# Patient Record
Sex: Male | Born: 1995 | Race: White | Hispanic: No
Health system: Western US, Academic
[De-identification: ages and names within clinical notes are randomized; demographics above are authoritative.]

## PROBLEM LIST (undated history)

## (undated) DIAGNOSIS — S4291XA Fracture of right shoulder girdle, part unspecified, initial encounter for closed fracture: Secondary | ICD-10-CM

## (undated) DIAGNOSIS — S329XXA Fracture of unspecified parts of lumbosacral spine and pelvis, initial encounter for closed fracture: Secondary | ICD-10-CM

## (undated) DIAGNOSIS — IMO0002 Reserved for concepts with insufficient information to code with codable children: Secondary | ICD-10-CM

## (undated) HISTORY — PX: BACK SURGERY: SHX140

## (undated) HISTORY — DX: Fracture of right shoulder girdle, part unspecified, initial encounter for closed fracture: S42.91XA

## (undated) HISTORY — DX: Reserved for concepts with insufficient information to code with codable children: IMO0002

## (undated) HISTORY — DX: Fracture of unspecified parts of lumbosacral spine and pelvis, initial encounter for closed fracture: S32.9XXA

## (undated) HISTORY — PX: PR UNLISTED PROCEDURE SHOULDER: 23929

---
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
## 2017-03-14 ENCOUNTER — Encounter (INDEPENDENT_AMBULATORY_CARE_PROVIDER_SITE_OTHER): Payer: Self-pay | Admitting: Family Medicine

## 2017-03-14 ENCOUNTER — Ambulatory Visit (INDEPENDENT_AMBULATORY_CARE_PROVIDER_SITE_OTHER): Payer: No Typology Code available for payment source | Admitting: Family Medicine

## 2017-03-14 ENCOUNTER — Ambulatory Visit (INDEPENDENT_AMBULATORY_CARE_PROVIDER_SITE_OTHER): Payer: Self-pay

## 2017-03-14 VITALS — BP 147/87 | HR 88 | Temp 98.5°F | Wt 190.0 lb

## 2017-03-14 DIAGNOSIS — S99911A Unspecified injury of right ankle, initial encounter: Secondary | ICD-10-CM
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
## 2017-03-19 ENCOUNTER — Telehealth (INDEPENDENT_AMBULATORY_CARE_PROVIDER_SITE_OTHER): Payer: Self-pay

## 2017-03-19 NOTE — Telephone Encounter (Signed)
Spoke to Eric Flowers (Eric Flowers) on 03/18/17, about having the x-ray [facility] to [facility] for [place]'s [place]. She wanted to get the x-ray to be sent a.s.a.p.  Contacted the [facility] File Room, they gave 2 options for expediting the x-ray (CD).[employer] or [employer].  Eric BadKristie [place] to [place] [place], and [facility] will mail out the CD.([employer])    If she changes her mind, she will download the ROI from website and have Eric Flowers sign it and bring to [facility], where we will give her the CD to mail out.    Also faxing Chart notes, per patient's request from visit on 03/14/17.    Faxing to [phone]  PH:[phone]

## 2017-07-19 ENCOUNTER — Telehealth (HOSPITAL_BASED_OUTPATIENT_CLINIC_OR_DEPARTMENT_OTHER): Payer: Self-pay | Admitting: Orthopaedic Surgery
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
## 2017-07-22 NOTE — Telephone Encounter (Signed)
Appointment must be scheduled as a new patient. Routing to [facility] for review and/or completion of task

## 2017-07-22 NOTE — Telephone Encounter (Signed)
Spoke to patients mother and scheduled appointment.    Letter sent in mail.

## 2017-07-29 ENCOUNTER — Encounter (HOSPITAL_BASED_OUTPATIENT_CLINIC_OR_DEPARTMENT_OTHER): Payer: Self-pay | Admitting: Orthopaedic Surgery

## 2017-08-01 ENCOUNTER — Other Ambulatory Visit (HOSPITAL_BASED_OUTPATIENT_CLINIC_OR_DEPARTMENT_OTHER): Payer: Self-pay

## 2017-08-01 DIAGNOSIS — S32810D Multiple fractures of pelvis with stable disruption of pelvic ring, subsequent encounter for fracture with routine healing: Secondary | ICD-10-CM

## 2017-08-05 ENCOUNTER — Ambulatory Visit (HOSPITAL_BASED_OUTPATIENT_CLINIC_OR_DEPARTMENT_OTHER): Payer: No Typology Code available for payment source | Attending: Orthopaedic Surgery | Admitting: Registered Nurse

## 2017-08-05 ENCOUNTER — Ambulatory Visit (HOSPITAL_BASED_OUTPATIENT_CLINIC_OR_DEPARTMENT_OTHER): Payer: No Typology Code available for payment source

## 2017-08-05 VITALS — BP 133/78 | HR 72 | Temp 97.7°F | Resp 16

## 2017-08-05 DIAGNOSIS — S32810D Multiple fractures of pelvis with stable disruption of pelvic ring, subsequent encounter for fracture with routine healing: Secondary | ICD-10-CM

## 2017-08-05 DIAGNOSIS — M25552 Pain in left hip: Secondary | ICD-10-CM

## 2017-08-05 DIAGNOSIS — M79652 Pain in left thigh: Secondary | ICD-10-CM | POA: Insufficient documentation
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
## 2017-08-05 NOTE — Patient Instructions (Signed)
Here are some general instructions and information given to all patients:  1. Avoid NSAIDs (Aleve, Advil, Motrin, Ibuprofen) until you are instructed that it is ok.  2. Please take adequate Vitamin D and Calcium which can be prescribed by your primary care provider or purchased from your local pharmacy  3.  It is important to follow the  weight bearing restrictions reviewed with you today.  4. Smoking even one cigarette a day  has been studied and shown to slow fracture healing .    It was nice to meet you today. Please contact us if you have any questions or concerns that still need addressing.     [facility]   [phone]

## 2017-08-09 ENCOUNTER — Encounter (HOSPITAL_BASED_OUTPATIENT_CLINIC_OR_DEPARTMENT_OTHER): Payer: Self-pay | Admitting: Orthopaedic Surgery

## 2017-09-10 ENCOUNTER — Encounter (HOSPITAL_BASED_OUTPATIENT_CLINIC_OR_DEPARTMENT_OTHER): Payer: Self-pay | Admitting: Physical Medicine & Rehabilitation

## 2017-09-10 ENCOUNTER — Ambulatory Visit (HOSPITAL_BASED_OUTPATIENT_CLINIC_OR_DEPARTMENT_OTHER)
Payer: No Typology Code available for payment source | Attending: Physical Medicine & Rehabilitation | Admitting: Physical Medicine & Rehabilitation

## 2017-09-10 VITALS — BP 153/84 | HR 84 | Ht 66.0 in | Wt 190.6 lb

## 2017-09-10 DIAGNOSIS — M76891 Other specified enthesopathies of right lower limb, excluding foot: Secondary | ICD-10-CM | POA: Insufficient documentation

## 2017-09-10 DIAGNOSIS — Z8781 Personal history of (healed) traumatic fracture: Secondary | ICD-10-CM | POA: Insufficient documentation

## 2017-09-10 NOTE — Progress Notes (Signed)
TEL: [phone]    FAX: [phone]  [email or web address]    Specializing in sports-related injuries and non-surgical care for conditions of the spine,  shoulder, elbow, wrist, hand, hip, knee, foot, and ankle.        09/10/2017    PRIMARY CARE PROVIDER:   Ree Kidaarol J Wilder, MD  [facility] [address]  [place], [place] [id number]    CONSULTING PROVIDER:   Jen MowReena Neogi  [address]  [place] [phone]  [place] [phone]    PATIENT: Eric Flowers    [id number]    REASON FOR VISIT/CC: Posterior hip pain    Dear Dr. Lennox LaityNeogi,    I had the pleasure of seeing your patient Eric GentaConnor R Jurek in consultation today. As you know, Eric Flowers is a very pleasant 22 year old yo with left hip/buttock pain.    The patient describes their pain as...  Onset:     September 2012 ORIF of pelvic ring disruption consisting a pubic symphysis disruption, left acetabular fracture, and left SI joint disruption.  New left proximal medial and posterior thigh pain since November 2018. Recent exam by orthopedic team was not concerning for pelvic instability or intra-articular hip pathology. Prior imaging demonstrates fracture of screws since 2013, but no interval backing out of screws compared to new images.  They would not recommend hardware removal from anterior pelvic ring.  Rather they recommended patient be evaluated by Dr. Eulogio BearLoveless evaluation for soft tissue injury including diagnostic ultrasound.  Location: posterior hip   Radiation: Lateral to medial posterior hip  Characteristic: 0 / 10 today (at worst 4/10), sharp, momentary (minute of pain improved to only seconds of pain)  Exacerbation: Stepping over a couch, walking, picking up a bag of sand  Relief: stretching the hip  Course: Intermittent (initially happening 3-5x per week, but has been improving recently)  Associated Symptoms: Denies numbness or tingling in the lower extremities  Other Treatments/Procedures Tried:    Weight loss       Working out x 2 weeks (elliptical)   Employed at [employer] - lots of heavy lifting  Prior Imaging/Diagnostics    08/05/17 XR Pelvis: FINDINGS AND IMPRESSION:  Status post screw fixation of the left SI joint and plate and screw fixation   of the bilateral parasymphyseal obturator ring fractures. As before, there is   fracture of the first, second and fourth screws from the right along with a   fracture of the plate.     Appropriate progression towards healing. No significant change in position or   alignment. No new fracture or dislocation.    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       06/14/11 x-ray lumbar spine: Findings:  Normal alignment. No instability on dynamic views. Since the initial postop   study, there has been slightly more wedging of the T12 fractured vertebral   body. This is unchanged from the most recent comparison study in December,   2012. The remainder the visualized spine is unremarkable. 2 left sacroiliac   screws remain in place.    PAST MEDICAL HISTORY:  Patient Active Problem List   Diagnosis    Contusion of right knee       PAST SURGICAL HISTORY:   He  has a past surgical history that includes UNLISTED PROCEDURE SPINE (2012); ANES; PELVIS/HIP JOINT SURGERY UNLISTED (2012); UNLISTED PROCEDURE SPINE (2012); and UNLISTED PROCEDURE SHOULDER.  ALLERGIES:   Review of patient's allergies indicates:  No Known Allergies    MEDICATIONS:   Current Outpatient Medications   Medication Sig Dispense Refill    Dexmethylphenidate HCl (FOCALIN) 10 MG Oral Tab Take 20 mg by mouth.       Dexmethylphenidate HCl ER 15 MG Oral CAPSULE SR 24 HR       GuanFACINE HCl 1 MG Oral Tab       GuanFACINE HCl ER 2 MG Oral TABLET SR 24 HR        No current facility-administered medications for this visit.        FAMILY HISTORY:   Pertinent family history includes Cancer in his maternal grandmother and mother; Heart (other) in his father and paternal  grandmother; Hypertension in his maternal grandfather; Prostate Cancer in his maternal grandfather and paternal grandfather; Thyroid Disease in his mother.    SOCIAL HISTORY:  He  reports that he has never smoked. He has never used smokeless tobacco. He reports that he does not drink alcohol or use drugs.  Patient is in college at [employer].     REVIEW OF SYSTEMS: Except for those mentioned in the HPI and listed below, complete review of systems is negative: Easy bruising, dark stools      PHYSICAL EXAMINATION:  VITAL SIGNS: BP (!) 153/84    Pulse 84    Ht 5\' 6"  (1.676 m)    Wt 190 lb 9.6 oz (86.5 kg)    BMI 30.76 kg/m  Exercise Vitals: Total Minutes of Exercise per Week: 180.  GENERAL: He is Well-Developed and Well Nourished. They are alert and oriented.  PSYCHOLOGICAL:  No acute distress. Mood euthymic. Normal affect.  HEENT: Normal cephalic, atraumatic. Non-interic sclera. Moist mucous membranes   RESPIRATORY:  Non-labored breathing  CARDIOVASCULAR: No cyanosis, clubbing or edema. Intact peripheral pulses.  SKIN: No atrophy, rashes or open wounds involving the lower extremities    Neuro:  Strength is 5/5 Lower extremities bilaterally with the exception of 4/5 prone hip external rotation (with knee flexion) due to pain reproduction.   Able to walk on heels and toes 10 steps.  Sensation intact to light touch in dermatomes L2-S1  Deep tendon Reflexes 2+ and symmetric    MSK Hip:  Gait: Normal  Passive Internal rotation:  Full without pain    Passive External rotation: Full and no pain  Palpation: No TTP over greater trochanter, glute med, ischial tuberosity  FADIR maneuver: Negative   Scour: Negative  FABER (Patricks): Negative Causes   Resisted straight leg raise (Stinchfield's): Negative  Resisted hip flexion with knee flexed: Negative  Resisted hip external rotation (lateral rotators): Reproduces pain posteriorly   No pain with resisted adduction.     SI joint provocative  maneuvers:  Gaeslens: negative, Sacral thrust: negative, Posterior  thigh thrust: negative    Imaging: Reviewed with the patient.      Impression  1. Left Posterior Hip Pain   2. Strain/Tendinitis of the Hip External Rotators    Discussion/Medical Decision Making:  I had thorough discussion with the patient regarding the above impression and foregoing management plan.     His history and exam today were consistent with a strain or tendinitis of the external hip rotators (which include the piriformis, gemelli, internal obturator, and quadratus femoris muscles).  Normally I would not recommend a diagnostic ultrasound or MRI for pain that is improving and becoming less frequent, especially in the setting of increased exercise;  I would treat this injury with physical therapy primarily.  However this patient has unique history with pelvic hardware and known fractured screws in place since 2013.  Therefore there could be a complicating factor if there is posterior displacement of the hardware into the muscle belly or tendon of one of the hip external rotators.  Given the patient's improvement I suspect this is unlikely, but both the patient and his surgical team are interested in the results of the diagnostic ultrasound.  Regarding the diagnostic ultrasound, we will schedule it at [facility] on July 17th at 11:30 AM.     I spent a total time of 45 minutes face-to-face with the patient, of which more than 50% was spent counseling and coordinating care and reviewing records with the patient as outlined in this note.    Charise Carwin, M.D., [facility]  Clinical Assistant Professor & Attending Physician  [facility] at [facility] and [facility]

## 2017-09-10 NOTE — Patient Instructions (Signed)
Your exam today was consistent with a strain or tendinitis of the external hip rotators (which include the piriformis, gemelli, internal obturator, and quadratus femoris muscles). The question will be "is there a screw or hardware projecting into one of these muscles?" although I suspect this is unlikely given your improvement.    I would treat this injury with physical therapy primarily.     Regarding your diagnostic ultrasound, we will schedule it at [facility] on July 17th at 11:30 AM.

## 2017-09-12 ENCOUNTER — Telehealth (HOSPITAL_BASED_OUTPATIENT_CLINIC_OR_DEPARTMENT_OTHER): Payer: Self-pay | Admitting: Orthopaedic Surgery

## 2017-09-12 NOTE — Telephone Encounter (Signed)
Per patient requested scheduled an appointment on Pelvic day after July 22nd

## 2017-09-12 NOTE — Telephone Encounter (Signed)
(  TEXTING IS AN OPTION FOR [facility] CLINICS ONLY)  Is this a [facility] clinic? No      RETURN CALL: Detailed message on voicemail only      SUBJECT:  Appointment Request     REASON FOR REQUEST/SYMPTOMS: Screws backing out  REFERRING PROVIDER: na  REQUEST APPOINTMENT WITH: Dr Everlean PattersonKleweno  REQUESTED DATE: 7/24, TIME: Early morning or 1130 or after 4  UNABLE TO APPOINT BECAUSE: Please callback to discuss a earlier appointment as patient had to cancel 7/15 as imaging isnt until 7/17 and patient needs sooner then CCR next available in August as patient is going back to college. Please callback to discuss a sooner appointment, thanks.

## 2017-09-16 ENCOUNTER — Encounter (HOSPITAL_BASED_OUTPATIENT_CLINIC_OR_DEPARTMENT_OTHER): Payer: No Typology Code available for payment source | Admitting: Orthopaedic Surgery

## 2017-09-18 ENCOUNTER — Other Ambulatory Visit (HOSPITAL_BASED_OUTPATIENT_CLINIC_OR_DEPARTMENT_OTHER): Payer: No Typology Code available for payment source | Admitting: Physical Medicine & Rehabilitation

## 2017-09-23 ENCOUNTER — Ambulatory Visit
Payer: No Typology Code available for payment source | Attending: Physical Medicine & Rehabilitation | Admitting: Physical Medicine & Rehabilitation

## 2017-09-23 ENCOUNTER — Encounter (HOSPITAL_BASED_OUTPATIENT_CLINIC_OR_DEPARTMENT_OTHER): Payer: Self-pay | Admitting: Physical Medicine & Rehabilitation

## 2017-09-23 VITALS — BP 136/86 | HR 75 | Ht 66.5 in | Wt 190.0 lb

## 2017-09-23 DIAGNOSIS — Z8781 Personal history of (healed) traumatic fracture: Secondary | ICD-10-CM

## 2017-09-23 DIAGNOSIS — M76891 Other specified enthesopathies of right lower limb, excluding foot: Secondary | ICD-10-CM | POA: Insufficient documentation

## 2017-09-23 NOTE — Progress Notes (Signed)
Complete diagnostic ultrasound scan of the posterior hip  Patient position: Prone  Probe: A combination of a curvilinear array transducer and a linear array transducer was used.  Indication: Posterior hip pain  Scanning:  A complete diagnostic scan of the posterior thigh was performed. The following areas were viewed in both their anatomic long and short axis: The gluteus maximus and medius muscles.  The ischial tuberosity, the conjoint tendon, the proximal semimembranosus tendon, the ischiocondylar head of the adductor magnus, the quadratus femoris, the piriformis, the obturator internus, the gemellus inferior and superior and sciatic nerve. The semimembranosus and semitendinosus muscle bellies, the biceps femoris muscle belly both long and short heads and the myotendinous junction. The ischiofemoral space with dynamic hip external rotation.     Findings:  Gluteus medius and minimus and maximus: these muscle bellies were viewed overlying the following structures; there was no apparent abnormality within the muscle bellies.    Piriformis,  gemellus superior, obturator internus, gemellus inferior, quadratus femoris : Normal echogenicity in the region of interest; no evidence of hardware protruding into muscle bellies; no evidence of high grade muscle tearing on dynamic resistance; no evidence of tendon thickening or calfications    Sciatic nerve: No sign of nerve compression, with consistent cross sectional area in SAX, without notch sign on LAX. There was no dynamic impingement of the nerve with resisted external rotation.     Semitendinosus, semimembranosus, biceps femoris, and ischiocondylar head of adductor magnus: Normal echogenicity in the region of interest; no evidence of tendon thickening, hypoechoic regions, calcifications, or associated cortical irregularity at the ischium    Ischiofemoral space: The ischiofemoral space measured 1.4 cm with dynamic hip external rotation. The patient denied pain  reproduction in this position.      IMPRESSIONS:   1. Normal Posterior Left Hip. (No sign of hardware protrusion into the posterior hip musculature)  2. Radiographic diagnosis of asymptomatic ischiofemoral impingement (1.4 cm). This could be better assessed with MRI to evaluate for quadratus femoris muscle belly edema.      Charise CarwinErek W. Xochilth Standish, M.D., [facility]  Clinical Assistant Professor & Attending Physician  [facility] at [facility] and [facility]

## 2017-09-30 ENCOUNTER — Ambulatory Visit (HOSPITAL_BASED_OUTPATIENT_CLINIC_OR_DEPARTMENT_OTHER): Payer: No Typology Code available for payment source | Attending: Registered Nurse | Admitting: Registered Nurse

## 2017-09-30 ENCOUNTER — Other Ambulatory Visit: Payer: Self-pay

## 2017-09-30 VITALS — BP 136/79 | HR 87 | Ht 67.0 in | Wt 185.0 lb

## 2017-09-30 DIAGNOSIS — S32810D Multiple fractures of pelvis with stable disruption of pelvic ring, subsequent encounter for fracture with routine healing: Secondary | ICD-10-CM | POA: Insufficient documentation

## 2017-09-30 DIAGNOSIS — M25552 Pain in left hip: Secondary | ICD-10-CM

## 2017-09-30 NOTE — Progress Notes (Signed)
Chief Complaint:  Status post ORIF of pelvic ring disruption consisting of pubic symphysis disruption, left acetabular fracture, and left SI joint disruption performed by Dr. Jyl Heinz on 27 November 2010.    HPI:  Patient is a 22 year old male presenting 6-1/2 years status post ORIF of pelvic ring disruption consisting of pubic symphysis disruption, left acetabular fracture, and left SI joint disruption performed by Dr. Jyl Heinz in September 2012 who was seen in clinic 2 months ago for intermittent sudden sharp aching pain throughout left proximal medial and posterior thigh that began in November 2018 and occurred in increasing frequency over the prior 6 months. Since his last visit, patient reports that this pain has progressively improved. Attributes this improvement in symptoms with recent increase in his activity and loss of weight. Patient states that he has been working at a job the last couple months that requires a lot of lifting and strength, which he feels has improved his overall strength and function. Patient was also seen by [facility] PM[facility]R team for evaluation for potential soft tissue injury of his left hip. They found that his history and exam were consistent with a strain or tendinitis of the external hip rotators, no concerning or clinically significant findings noted on ultrasound. Patient reports that overall he is doing well and happy with his progress to date. He plans to return to [place] in the coming weeks to attend college, would like to attend physical therapy while there. Denies any weakness in his left lower extremity. Denies fever, chills, chest pain, or difficulty breathing. Denies any new numbness, weakness, tingling, or paresthesia. Denies any bowel or bladder issues.     Physical Exam:   GENERAL: Well-developed, no acute distress  VS:   Vitals:    09/30/17 1121   BP: 136/79   BP Cuff Size: Large   BP Site: Right Arm   BP Position: Sitting   Pulse: 87   SpO2: 97%   Weight: 185  lb (83.9 kg)   Height: 5\' 7"  (1.702 m)     MUSCULOSKELETAL: Pelvis Right lower extremity   Inspection: Incisions well healed, no drainage or surrounding erythema.    ROM: hip range of motion 0-90, knee range of motion 0-130, ankle dorsiflexion to 10, plantarflexion to 30.  Motor: Quadriceps, hamstrings, gastrocsoleus complex, tibialis anterior, EHL, and FHL intact all with 5/5 strength. No pain with provocative testing of left hip, SI joint, or anterior pelvic ring.   Sensation: Grossly intact to light touch along tibial, sural, saphenous, deep and superficial peroneal nerve distributions.   Vascular: Warm and well-perfused, dorsalis pedis and posterior tibial pulse 2+.    Imaging:  No x-rays ordered today.     Impression:   Patient is a 22 year old male presenting 6-1/2 years status post ORIF of pelvic ring disruption consisting of pubic symphysis disruption, left acetabular fracture, and left SI joint disruption performed by Dr. Jyl Heinz in September 2012 who was seen in clinic 2 months ago for intermittent sudden sharp aching pain throughout left proximal medial and posterior thigh that began in November 2018 and occurred in increasing frequency over the prior 6 months. Since his last visit, patient reports that this pain has progressively improved. Overall, patient is clinically stable and progressing well.     Plan:   - Weightbear as tolerated on bilateral lower extremities; range of motion as tolerated.   - Referral to physical therapy provided today for patient to work on gait training, balance and  proprioception, hip and  lumbo-pelvic core stabilization exercises, and active, active assisted, and passive range of motion exercises of the bilateral lower extremities with particular focus on hip ROM, low impact conditioning exercises and progressive strengthening exercises of quadriceps, hamstrings, abductors, hip flexors, and gluteal muscles. Edema control and other modalities as needed.   - May take  ibuprofen, with food, or tylenol as needed for pain.     Patient may return to clinic on as needed basis.     Patient evaluated and plan formulated with Dr. Everlean PattersonKleweno.

## 2017-09-30 NOTE — Patient Instructions (Signed)
Here are some general instructions and information given to all patients:  1. Avoid NSAIDs (Aleve, Advil, Motrin, Ibuprofen) until you are instructed that it is ok.  2. Please take adequate Vitamin D and Calcium which can be prescribed by your primary care provider or purchased from your local pharmacy  3.  It is important to follow the  weight bearing restrictions reviewed with you today.  4. Smoking even one cigarette a day  has been studied and shown to slow fracture healing .    It was nice to meet you today. Please contact us if you have any questions or concerns that still need addressing.     [facility]   [phone]

## 2018-12-15 ENCOUNTER — Emergency Department (HOSPITAL_COMMUNITY): Payer: BC Managed Care – PPO

## 2018-12-15 ENCOUNTER — Emergency Department (HOSPITAL_COMMUNITY)
Admission: EM | Admit: 2018-12-15 | Discharge: 2018-12-15 | Disposition: A | Discharge status: Home / Self Care | Payer: BC Managed Care – PPO | Attending: Emergency Medicine | Admitting: Emergency Medicine

## 2018-12-15 ENCOUNTER — Other Ambulatory Visit: Payer: Self-pay

## 2018-12-15 ENCOUNTER — Encounter (HOSPITAL_COMMUNITY): Payer: Self-pay | Admitting: *Deleted

## 2018-12-15 DIAGNOSIS — Z967 Presence of other bone and tendon implants: Secondary | ICD-10-CM | POA: Diagnosis not present

## 2018-12-15 DIAGNOSIS — Z20828 Contact with and (suspected) exposure to other viral communicable diseases: Secondary | ICD-10-CM | POA: Diagnosis not present

## 2018-12-15 DIAGNOSIS — R079 Chest pain, unspecified: Secondary | ICD-10-CM | POA: Diagnosis present

## 2018-12-15 DIAGNOSIS — R0789 Other chest pain: Secondary | ICD-10-CM | POA: Diagnosis not present

## 2018-12-15 LAB — CBC WITH DIFFERENTIAL/PLATELET
Abs Immature Granulocytes: 0.04 10*3/uL (ref 0.00–0.07)
Basophils Absolute: 0.1 10*3/uL (ref 0.0–0.1)
Basophils Relative: 0 %
Eosinophils Absolute: 0.1 10*3/uL (ref 0.0–0.5)
Eosinophils Relative: 1 %
HCT: 47.7 % (ref 39.0–52.0)
Hemoglobin: 17.8 g/dL — ABNORMAL HIGH (ref 13.0–17.0)
Immature Granulocytes: 0 %
Lymphocytes Relative: 12 %
Lymphs Abs: 1.6 10*3/uL (ref 0.7–4.0)
MCH: 31.9 pg (ref 26.0–34.0)
MCHC: 37.3 g/dL — ABNORMAL HIGH (ref 30.0–36.0)
MCV: 85.5 fL (ref 80.0–100.0)
Monocytes Absolute: 1.3 10*3/uL — ABNORMAL HIGH (ref 0.1–1.0)
Monocytes Relative: 9 %
Neutro Abs: 10.5 10*3/uL — ABNORMAL HIGH (ref 1.7–7.7)
Neutrophils Relative %: 78 %
Platelets: 157 10*3/uL (ref 150–400)
RBC: 5.58 MIL/uL (ref 4.22–5.81)
RDW: 12.1 % (ref 11.5–15.5)
WBC: 13.7 10*3/uL — ABNORMAL HIGH (ref 4.0–10.5)
nRBC: 0 % (ref 0.0–0.2)

## 2018-12-15 LAB — BASIC METABOLIC PANEL
Anion gap: 13 (ref 5–15)
BUN: 9 mg/dL (ref 6–20)
CO2: 23 mmol/L (ref 22–32)
Calcium: 9.9 mg/dL (ref 8.9–10.3)
Chloride: 104 mmol/L (ref 98–111)
Creatinine, Ser: 0.89 mg/dL (ref 0.61–1.24)
GFR calc Af Amer: >60 mL/min (ref >60)
GFR calc non Af Amer: >60 mL/min (ref >60)
Glucose, Bld: 102 mg/dL — ABNORMAL HIGH (ref 70–99)
Potassium: 3.3 mmol/L — ABNORMAL LOW (ref 3.5–5.1)
Sodium: 140 mmol/L (ref 135–145)

## 2018-12-15 LAB — SARS CORONAVIRUS 2 (TAT 6-24 HRS): SARS Coronavirus 2: NEGATIVE

## 2018-12-15 MED ORDER — IOHEXOL 350 MG/ML SOLN
100.0000 mL | Freq: Once | INTRAVENOUS | Status: AC | PRN
  Administered 2018-12-15: 100 mL via INTRAVENOUS

## 2018-12-15 MED ORDER — CYCLOBENZAPRINE HCL 10 MG PO TABS: 10.0000 mg | ORAL_TABLET | Freq: Two times a day (BID) | ORAL | 0 refills | Status: AC | PRN

## 2018-12-15 MED ORDER — IBUPROFEN 800 MG PO TABS: 800.0000 mg | ORAL_TABLET | Freq: Three times a day (TID) | ORAL | 0 refills | Status: AC

## 2018-12-15 NOTE — ED Provider Notes (Signed)
[facility] EMERGENCY DEPARTMENT Provider Note   CSN: [id number] Arrival date & time: 12/15/18  0007     History   Chief Complaint Chief Complaint  Patient presents with  . Chest Pain    HPI Alexander Mclaughlin is a 23 y.o. male.     Patient presents to the emergency department with a chief complaint of right sided upper chest pain.  He states that the symptoms began about 7 to 10 days ago.  He reports onset after inhaling whippets.  He states that the symptoms have been persistent.  He denies any fever, chills, cough.  States the symptoms are worsened with breathing.  Denies any other associated symptoms.  Denies any successful treatments prior to arrival.  Denies any history of PE or DVT.  Denies any recent surgery, long travel, or immobilization.  The history is provided by the patient. No language interpreter was used.    History reviewed. No pertinent past medical history.  There are no active problems to display for this patient.   Past Surgical History:  Procedure Laterality Date  . BACK SURGERY          Home Medications    Prior to Admission medications   Not on File    Family History No family history on file.  Social History Social History   Tobacco Use  . Smoking status: Never Smoker  Substance Use Topics  . Alcohol use: Yes  . Drug use: Not Currently     Allergies   Patient has no allergy information on record.   Review of Systems Review of Systems  All other systems reviewed and are negative.    Physical Exam Updated Vital Signs BP (!) 161/98   Pulse (!) 108   Temp 98.6 F (37 C) (Oral)   Resp 18   Ht 5\' 8"  (1.727 m)   Wt 88.5 kg   SpO2 100%   BMI 29.65 kg/m   Physical Exam Vitals signs and nursing note reviewed.  Constitutional:      Appearance: He is well-developed.  HENT:     Head: Normocephalic and atraumatic.  Eyes:     Conjunctiva/sclera: Conjunctivae normal.  Neck:     Musculoskeletal: Neck  supple.  Cardiovascular:     Rate and Rhythm: Normal rate and regular rhythm.     Heart sounds: No murmur.  Pulmonary:     Effort: Pulmonary effort is normal. No respiratory distress.     Breath sounds: Normal breath sounds.  Abdominal:     Palpations: Abdomen is soft.     Tenderness: There is no abdominal tenderness.  Skin:    General: Skin is warm and dry.  Neurological:     Mental Status: He is alert.      ED Treatments / Results  Labs (all labs ordered are listed, but only abnormal results are displayed) Labs Reviewed  SARS CORONAVIRUS 2 (TAT 6-24 HRS)  CBC WITH DIFFERENTIAL/PLATELET  BASIC METABOLIC PANEL    EKG EKG Interpretation  Date/Time:  Monday December 15 2018 00:15:24 EDT Ventricular Rate:  112 PR Interval:  132 QRS Duration: 88 QT Interval:  328 QTC Calculation: 447 R Axis:   99 Text Interpretation:  Sinus tachycardia Rightward axis Borderline ECG No old tracing to compare Confirmed by Delora Fuel ([id number]) on 12/15/2018 12:23:34 AM   Radiology Dg Chest 2 View  Result Date: 12/15/2018 CLINICAL DATA:  Chest pain EXAM: CHEST - 2 VIEW COMPARISON:  None. FINDINGS: The heart size and mediastinal  contours are within normal limits. Both lungs are clear. Partially visualized spinal rods and fixating screws. There is a fracture through the left spinal rod at the approximate L2 level. IMPRESSION: No active cardiopulmonary disease. Fracture through the left spinal rod at approximate L2 level Electronically Signed   By: Jasmine Pang M.D.   On: 12/15/2018 00:37    Procedures Procedures (including critical care time)  Medications Ordered in ED Medications - No data to display   Initial Impression / Assessment and Plan / ED Course  I have reviewed the triage vital signs and the nursing notes.  Pertinent labs & imaging results that were available during my care of the patient were reviewed by me and considered in my medical decision making (see chart for  details).        Patient with right upper chest pain.  Worsened with deep breathing.  Noted to be tachycardic.  No PE risk factors.  No pneumothorax seen on chest x-ray, but given patient's history of forcefully inhaling whippets, he may benefit from advanced imaging of his chest.  Given his persistent tachycardia, will make this a PE study to look for both PE and occult pneumothorax.  Of note, patient's chest x-ray showed a fracture of the hardware in his spine at the L2 level.  I discussed this with the patient.  He will follow up with his surgeon.  CT PE study negative for PE, fracture, or pneumothorax.  O2 saturation is normal.  Vital signs are stable.  Pain somewhat reproducible with palpation.  Will treat with NSAIDs Flexeril.  Recommend outpatient follow-up.  Final Clinical Impressions(s) / ED Diagnoses   Final diagnoses:  Chest wall pain  Fixation hardware in spine    ED Discharge Orders         Ordered    cyclobenzaprine (FLEXERIL) 10 MG tablet  2 times daily PRN     12/15/18 0446    ibuprofen (ADVIL) 800 MG tablet  3 times daily     12/15/18 0446           Roxy Horseman, PA-C 12/15/18 0447    Mesner, Barbara Cower, MD 12/15/18 [phone]

## 2018-12-15 NOTE — Discharge Instructions (Addendum)
Your emergency department work-up is reassuring, there was no evidence of collapsed lung, infection, rib fracture, or pulmonary embolus seen on your CT scan.  There was an incidental finding regarding the hardware in your spine.  There was a break in 1 of the rods at approximately at the L2 level.  Please discuss this with your doctor or with the surgeon the implant to the hardware.

## 2018-12-15 NOTE — ED Triage Notes (Signed)
Pt reports that he used whippets last week and since then he has a sharp pain in the upper right chest "in my lungs" when he takes a deep breath. He is denying feeling SOB, cough, fevers.

## 2019-01-26 ENCOUNTER — Telehealth (HOSPITAL_BASED_OUTPATIENT_CLINIC_OR_DEPARTMENT_OTHER): Payer: Self-pay | Admitting: Orthopaedic Surgery of the Spine

## 2019-01-26 NOTE — Telephone Encounter (Signed)
RETURN CALL: Voicemail - Detailed Message      SUBJECT:  Appointment Request     REASON FOR VISIT: ED Follow up / Back pain   PREFERRED DATE/TIME: On or after December 4th   ADDITIONAL INFORMATION: Patient went to [facility] in [place] due to back pain and it was discovered that the metal piece in his spine that was placed by Dr. Quintella Reichert was broken. Drue Dun would like to discuss scheduling options as she is very concerned. Please call her to discuss when you can.  Thank you!    [facility]  [address] [place] [address]  [phone]

## 2019-01-27 ENCOUNTER — Other Ambulatory Visit (HOSPITAL_BASED_OUTPATIENT_CLINIC_OR_DEPARTMENT_OTHER): Payer: Self-pay | Admitting: Physician Assistant

## 2019-01-27 DIAGNOSIS — Z981 Arthrodesis status: Secondary | ICD-10-CM

## 2019-02-06 ENCOUNTER — Ambulatory Visit (HOSPITAL_BASED_OUTPATIENT_CLINIC_OR_DEPARTMENT_OTHER): Payer: No Typology Code available for payment source | Attending: Physician Assistant | Admitting: Orthopaedic Surgery

## 2019-02-06 ENCOUNTER — Ambulatory Visit (HOSPITAL_BASED_OUTPATIENT_CLINIC_OR_DEPARTMENT_OTHER): Payer: No Typology Code available for payment source | Admitting: Diagnostic Radiology

## 2019-02-06 ENCOUNTER — Encounter (HOSPITAL_BASED_OUTPATIENT_CLINIC_OR_DEPARTMENT_OTHER): Payer: Self-pay

## 2019-02-06 VITALS — BP 134/75 | HR 72 | Temp 98.4°F | Ht 68.0 in | Wt 193.1 lb

## 2019-02-06 DIAGNOSIS — Z981 Arthrodesis status: Secondary | ICD-10-CM | POA: Insufficient documentation

## 2019-02-06 DIAGNOSIS — Z4789 Encounter for other orthopedic aftercare: Secondary | ICD-10-CM

## 2019-02-06 DIAGNOSIS — T8484XA Pain due to internal orthopedic prosthetic devices, implants and grafts, initial encounter: Secondary | ICD-10-CM | POA: Insufficient documentation

## 2019-02-06 NOTE — Progress Notes (Signed)
Eric Flowers  [id number]  02/06/2019        ORTHOPEDIC SPINE CLINIC NOTE         CHIEF COMPLAINT AND REASON FOR VISIT      Preoperative consultation for removal of hardware T12-L2      HISTORY      Eric Flowers is a 23 year old male here today for a preoperative visit concerning the aforementioned procedure.   Patient was found to have fractured hardware recently on an ER visit in [place].  Patient has continued to have pain in the thoracolumbar area around the previous incision site.  He notices this most when leaning forward or extending backwards.  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  He has noticed his posture has began to change as he is leaning forward.  He presents with mother today at bedside who voices her concern over upcoming changes in her insurance secondary to loss of job.         PHYSICAL EXAMINATION      GENERAL: The patient is a male in no acute distress. He can walk without using assistive devices    BP 134/75   Pulse 72   Temp 98.4 F (36.9 C)   Ht 5\' 8"  (1.727 m)   Wt 193 lb 1.6 oz (87.6 kg)   SpO2 98%   BMI 29.36 kg/m     MUSCULOSKELETAL:        Motor Strength Right Left   L2 Hip flexion (Iliopsoas) 5/5 5/5   L3 Knee extension (Quad) 5/5 5/5   L4 Ankle DF (TA) 5/5 5/5   L5 Great Toe DF (EHL) 5/5 5/5   S1 Ankle PF, Foot Eversion (Peroneal longus/brevis) 5/5 5/5   S2 Great toe flexion (FHL), Knee flexion  5/5 5/5     Sensation Right Left   L2 Proximal anterior thigh normal normal   L3 Mid anterior thigh normal normal   L4 Medial leg/foot, great toe (Saphenous n.) normal normal   L5 Dorsum of mid foot  normal normal   S1 Lateral leg/foot, little toe, Back of leg (Sural n.) normal normal     Reflexes Right Left   L4 Patellar 2+/4 2+/4   S1 Achilles 2+/4 2+/4   Clonus  0 beats 0 beats   Babinski absent absent            Gait is normal.         ENT:  Mucous membranes are moist, Good dentition and Exam  deferred  ABDOMEN:  Nondistended and No guarding  RESPIRATORY:  Unlabored breathing and Good excursion  CARDIOVASCULAR:  Regular rate and rhythm  PSYCHOLOGIC REVIEW:  Appropriate affect         IMAGING    Radiographs were reviewed today demonstrating previous T12-L2 fusion for an L1 burst fracture.  Patient was 15 when the initial injury occurred these injury films were reviewed and demonstrate healed fracture postoperatively with hardware intact.  There is no films until today's radiographs taken in office demonstrating fracture of the left-sided rod proximal to the distal screw and a right-sided screw head interface. USS 1 hardware present.       ASSESSMENT AND PLAN      Eric Flowers is 23 year old male who is here today for a preoperative consultation for painful hardware and removal of hardware T12-L2.  Patient continues to have back pain present secondary to fractured hardware.  Patient  is currently under his mother's insurance which unfortunately will expire secondary to loss of job.  Continues to have issues when attempting to perform activities and feels as though his posture is changing to a more stooped position. [place]    I discussed in detail risk benefits and alternatives of surgical intervention with patient and patient's mother at bedside.  Patient is elected to proceed.  I discussed the current situation with the COVID-19 virus.  Our plan would be to perform this on an outpatient basis with goals of perioperative pain control via long-acting subcutaneous injection postoperatively.    We will work with our care coordinator next week to help schedule and plan for intervention of removal of hardware.  Patient's mother Eric Flowers has given her cell phone number for communication purposes [phone].      He has tried activity modification. This has not provided ongoing relief.     Risks and benefits of the procedure were discussed with the patient  in-depth. Risks for potentially significant medical comorbidities including life-threatening thromboembolic events such as stroke, myocardial infarction, pulmonary embolism, deep vein thrombosis, among others. Risks including bleeding, infection, dural injury, cerebrospinal fluid leak, the potential need for reoperation at the same level or other levels, the potential for an unsatisfactory outcome. Risk of neurologic deficits including temporary and isolated nerve root deficit.    It was explained that [facility] is a teaching hospital and residents, PA's, and fellows may be present during the surgery. Cases may also overlap but the attending surgeon will be present during the critical portions of the procedure.     He understands these risks and still wishes to proceed with the procedure. Written consent was obtained from the patient and placed in the chart.     The patient verbalized understanding and agreement to the above plan, which was discussed with Dr. Estrella DeedsBellabarba, who examined the patient with me.            PAST MEDICAL HISTORY      Past Medical History:   Diagnosis Date   . Blood transfusion without reported diagnosis 11/2010    during his surgery   . Fracture of left pelvis (HCC)    . Shoulder fracture, right    . Spine fracture     L1 + L2      Patient Active Problem List   Diagnosis   . Contusion of right knee           MEDICATIONS    Current Outpatient Medications:   .  Dexmethylphenidate HCl (FOCALIN) 10 MG Oral Tab, Take 20 mg by mouth. , Disp: , Rfl:   .  Dexmethylphenidate HCl ER 15 MG Oral CAPSULE SR 24 HR, , Disp: , Rfl:   .  GuanFACINE HCl 1 MG Oral Tab, , Disp: , Rfl:   .  GuanFACINE HCl ER 2 MG Oral TABLET SR 24 HR, , Disp: , Rfl:       ALLERGIES      Review of patient's allergies indicates:  No Known Allergies      PAST SURGICAL HISTORY      Past Surgical History:   Procedure Laterality Date   . ANES; PELVIS/HIP JOINT SURGERY UNLISTED  2012   . UNLISTED PROCEDURE SHOULDER     .  UNLISTED PROCEDURE SPINE  2012   . UNLISTED PROCEDURE SPINE  2012            FAMILY HISTORY  family history includes Cancer in his maternal grandmother and mother; Heart (other) in his father and paternal grandmother; Hypertension in his maternal grandfather; Prostate Cancer in his maternal grandfather and paternal grandfather; Thyroid Disease in his mother.      SOCIAL HISTORY      Social History     Tobacco Use   . Smoking status: Never Smoker   . Smokeless tobacco: Never Used   Substance Use Topics   . Alcohol use: Yes     Frequency: 2-3 times a week     Comment: weekly   . Drug use: No       He does not smoke.

## 2019-02-11 ENCOUNTER — Ambulatory Visit (HOSPITAL_BASED_OUTPATIENT_CLINIC_OR_DEPARTMENT_OTHER): Payer: No Typology Code available for payment source

## 2019-02-11 ENCOUNTER — Ambulatory Visit (HOSPITAL_BASED_OUTPATIENT_CLINIC_OR_DEPARTMENT_OTHER): Payer: No Typology Code available for payment source | Attending: Orthopaedic Surgery of the Spine

## 2019-02-11 DIAGNOSIS — T8484XA Pain due to internal orthopedic prosthetic devices, implants and grafts, initial encounter: Secondary | ICD-10-CM | POA: Insufficient documentation

## 2019-02-11 LAB — SARS-COV-2 (COVID-19) QUALITATIVE RAPID PCR: COVID-19 Coronavirus Qual PCR Result: NOT DETECTED

## 2019-02-11 LAB — BASIC METABOLIC PANEL
Anion Gap: 6 (ref 4–12)
Calcium: 9.8 mg/dL (ref 8.9–10.2)
Carbon Dioxide, Total: 30 meq/L (ref 22–32)
Chloride: 101 meq/L (ref 98–108)
Creatinine: 0.88 mg/dL (ref 0.51–1.18)
Glucose: 86 mg/dL (ref 62–125)
Potassium: 4.1 meq/L (ref 3.6–5.2)
Sodium: 137 meq/L (ref 135–145)
Urea Nitrogen: 20 mg/dL (ref 8–21)
eGFR by CKD-EPI: >60 mL/min/{1.73_m2} (ref >59)

## 2019-02-11 LAB — CBC, DIFF
% Basophils: 1 %
% Eosinophils: 2 %
% Immature Granulocytes: 1 %
% Lymphocytes: 28 %
% Monocytes: 9 %
% Neutrophils: 59 %
% Nucleated RBC: 0 %
Absolute Eosinophil Count: 0.19 10*3/uL (ref 0.00–0.50)
Absolute Lymphocyte Count: 2.31 10*3/uL (ref 1.00–4.80)
Basophils: 0.07 10*3/uL (ref 0.00–0.20)
Hematocrit: 45 % (ref 38–50)
Hemoglobin: 16 g/dL (ref 13.0–18.0)
Immature Granulocytes: 0.1 10*3/uL — ABNORMAL HIGH (ref 0.00–0.05)
MCH: 30.3 pg (ref 27.3–33.6)
MCHC: 35.2 g/dL (ref 32.2–36.5)
MCV: 86 fL (ref 81–98)
Monocytes: 0.72 10*3/uL (ref 0.00–0.80)
Neutrophils: 5.01 10*3/uL (ref 1.80–7.00)
Nucleated RBC: 0 10*3/uL
Platelet Count: 213 10*3/uL (ref 150–400)
RBC: 5.28 10*6/uL (ref 4.40–5.60)
RDW-CV: 12.4 % (ref 11.6–14.4)
WBC: 8.4 10*3/uL (ref 4.30–10.00)

## 2019-02-11 LAB — PROTHROMBIN & PTT
Partial Thromboplastin Time: 28 s (ref 22–35)
Prothrombin INR: 1 (ref 0.8–1.3)
Prothrombin Time Patient: 12.6 s (ref 10.7–15.6)

## 2019-02-11 NOTE — Progress Notes (Signed)
EDUCATION ASSESSMENT:    Primary Learner: Patient and mother, Eric Flowers    Challenges impacting this teaching session: None    Teaching:    Method(s) used for teaching: Discussion, Patient teaching packet, Care Map      DISCHARGE READINESS:  PCP verified: Yes - None  Pain Service Specialist: No     Care Partner at home:   Name: Eric Flowers  Relationship: Mother  Contact information: [phone]  Length of days planning to stay with patient: Lives together, home with patient after surgery, ok providing postop care    Transportation specific plan at discharge:  [place] environment accessible without accommodations: Yes     Patient specific plans to discharge to: Home          LOS (subject to change and meeting discharge criteria reviewed): 0 days     Patient discharge ready:  Yes    Referral: N/A    PRE OPERATIVE INSTRUCTIONS:   Preparing the home for post-op care (family, pets)   Day of surgery instructions/what will happen   Patient Information Handbook provided to patient   Procedure specific instructions provided to patient   NPO guidelines provided to patient   Skin Prep instructions and CHG provided to patient   Estimated LOS and discharge criteria reviewed    POST OPERATIVE INSTRUCTIONS:   Contact Information   Pain Management   Assistive Devices   Activity restrictions   Discussed signs & symptoms of wound infection   Follow-up Appointments: Scheduled with [facility]      POST EDUCATION RESPONSE:    Post educational response: States understanding    Follow-up: Continue teaching topic

## 2019-02-11 NOTE — Patient Instructions (Signed)
Healthy Eating Before Surgery      8 HOURS BEFORE    you arrive at the hospital, you may eat a healthy, balanced diet. 6 HOURS BEFORE    you arrive at the hospital, You may eat a healthy snack. Avoid heavy foods, such as those with a large amount of fat.    IMPORTANT: AFTER YOUR SNACK DO NOT EAT ANYTHING ELSE. UP UNTIL 2 HOURS BEFORE    you arrive at the hospital, DO NOT EAT ANYTHING.  We encourage you to drink clear liquids such as water, plain tea or coffee (no milk or creamer), clear broth, Gatorade, soda, apple juice, or Boost Breeze liquid supplement.    If you have diabetes, avoid liquids with sugar such as juice, regular soda, and sports drinks, since these can raise your blood sugar. Drink water, plain tea or coffee (no milk or creamer), clear broth and diet soda. 2 HOURS BEFORE    you arrive at the hospital, DO NOT DRINK ANYTHING, unless your doctor or nurse has told you otherwise.     Your surgery may be postponed or cancelled if you do not follow these instructions.    Questions? 6am-6pm call [phone]. 6pm-6am call [phone]    Skin Prep Before Surgery    What is CHG soap?  Before your surgery, you will use a special soap to clean your body. The soap contains an antiseptic called chlorhexidine gluconate (CHG). Cleaning your body with CHG soap before surgery helps prevent infection.     If you have pain or itching after using the soap:    ? Stop using the soap.   ? Rinse the area well.  ? Tell staff about your pain or itching on the morning of your surgery.     If you are allergic to chlorhexidine, use regular soap to shower with.    Night Before Surgery  o Shower as usual with soap and water. Use regular shampoo on your hair. Then wash off the soap completely.   o Do not shave.   o Turn off the water and apply the CHG soap to your body from the neck down using a wash cloth. Do not use CHG soap on your face, hair, genitals, or areas where you have open sores.  o If possible, let the soap stay on  your skin for at least 1 minute.  o Rinse well.   o Use a clean towel to dry off.  o Do not apply any other soaps, lotions, moisturizers, or makeup after you have used the CHG soap.   o Put on clean nightwear and use clean sheets on your bed after you shower.    Morning of Surgery   o Repeat the procedure from the night before.   o Put on clean clothes after you shower.    Pain Control After Spine Surgery  What to expect    This handout describes the different types of pain you will have after spine surgery. It explains how we will help you manage your pain.    What kind of pain will I have after surgery?  Surgery causes acute (short-term) pain. Acute pain is part of healing.  After surgery, you will feel acute pain from:     The incision   Tissue damage   Inflammation, the body's natural response to inury    How is pain managed after surgery?  Pain control is a big part of your recovery. We will work with you to   manage you pain. We will do all we can to help make you comfortable.    You will not be pain-free for some time.  Our goal is to control your pain so you can do the activities that will help you recover. You will have discomfort as you recover and become more active.    We will ask you about your pain level many times. Please tell us how you are feeling. Your feedback is vital.     Opioid Pain Medicines  Most patients take prescription pain medicine (opioids) after surgery. Opioids help control acute pain. They are not used to treat chronic (long-term) pain. We will prescribe opioids for no more than 6 weeks after surgery.    Multi-modal Approach  We will use a multi-modal approach (various methods) to control your pain. This helps lower risk of some of the problems opioids can cause.      In the Hospital  At first, most patients will take both opioids and other pain medicines. Our goal is to start slowly decreasing your opioid dose even before you go home.    At Home  Keep tapering  (decreasing) your  opioid dose. Take it as prescribed at first. Then start to take a lower dose or wait longer between doses, or both.    When you take a dose of opioids, be patient. It can take 20 to 30 minutes for the opioids to start working. They may not reach their full effect for almost 1 hour.    If your provider says it is OK for you to take acetaminophen (Tylenol), you may take 650 mg every 6 hours or 1,000 mg every 8 hours. Doing this will help you taper your opioid dose.    If you did not have a spinal fusion and your provider says it is OK for you to take non-steroidal anti-inflammatory (NSAIDS), you may also take 400 to 600 mg of ibuprofen (Advil, Motrin) every 6 to 8 hours. Do not take NSAIDs if you have heart or kidney disease or a history of gastrointestinal bleeding (bleeding in your digestive tract).    How do I know which pain medicine to take?    This table can help you decide what to take for pain:  Pain level How It Feels What to Do   Mild Pain You feel some pain, but it does not keep you from your daily activities.   Use Tylenol as needed.   Use ice as needed (see page 3).   Moderate Pain Pain keeps you from doing the activites your provider has advised.   You need more pain control.   Take Tylenol 2 to 3 times a day.   Use ice after activity or when you feel pain (see page 3).   Take opioids only if needed.   Severe Pain Pain keeps you from doing any activity. You feel you cannot do basic tasks like getting out of bed, getting dressed, or walking to the bathroom.  Take Tylenol every 6 to 8 hours.   Take opioids as prescribed.   Use ice every 2 to 3 hours (see page 3).     Pain Control Without Medicines  To help manage your pain:    ? Use an ice pack. Every 2 to 3 hours, place an ice pack on the surgical area for about 20 minutes. To protect your skin from damage, place a clean towel between your skin and the ice pack.    ? Distract yourself.   Try relaxation, breathing, music, reading, and other ways to  focus your mind on something besides the pain.    For Your Safety   Do not use heat on your back until incision is fully healed.    ? While taking opioids:  ? Do not drink alcohol. Together, opioids and alcohol can make you dizzy and slow your breathing. They can even cause death.    ? Do not drive or use machines.    ? Store your opioids in a locked place.    ? We will give you a prescription for narcan (Naloxone). This drug is used to treat an opioid overdose. If you would like to learn more about narcan, please visit stopoverdose.org.    ? Dispose of any leftover opioids safely. [place] has a disposal program for medicines you no longer need. To find a drop-box near you, visit [email or web address]. You can also request a mail-back envelope if it is hard for you to leave home.    What if I have constipation?  Some pain medicines can cause constipation (hard stool). To help with this:    ? Eat more fiber. Eat plenty of fresh fruits and green leafy vegetables.    ? Drink lots of fluids (6 to 8 full glasses a day).    ? Stool softeners such as bisocodyl (Dulcolax), polyethylene glycol (Miralax), senna (Senokot), and docusate (Colace). You can buy these without a prescription. Each of these works differently to treat constipation.Take one or more of these, as directed, while taking opioids.     ? Call the clinic for more advice if these methods are not working.    What if I need refills after 6 weeks?  If you are nearing the end of your 6-week prescription, and you feel you still need opioids:     Talk with your primary care provider (PCP) or your pain clinic.     Meet with the provider who prescribes your chronic pain medicine before your opioids run out.    Who to Call   ? If your pain is not under control or it gets worse, call the Spine Clinic nurse at [phone] and press 8 when you hear the recording.    ? Call the [facility] at [phone] on weekdays if you:    ? Have  questions about your pain medicines  ? Want advice on how to taper your opioid use after surgery    ? Need a refill. You must either pick up your prescription at [place] or allow 72 business hours to have the written prescription mail to you.    ? Call your PCP if you need any other prescriptions filled, such as for a muscle relaxant.    Activities of Daily Living After Spinal Decompression Surgery  Self-care for safety and healing    This handout gives self-care guidelines to follow after spinal decompression surgery. Follow these guidelines to protect your spine and help you recover.    Self-care  Follow the guidelines in this handout for your daily activities. At first, you may need to take a lot of breaks. Be sure to include rest times in our plan for each day.    Also, make sure you:   Get a good night's sleep.   Get dressed every day.   Slowly resume the hobbies or other activities you enjoy.    Protect Your Spine  For 6 weeks, follow the BLTs for bending, lifting, and twisting:     Bending:   Do not bend your spine.     Lifting: Do not lift more han 10 pounds. (A gallon of milk weighs almost 9 pounds.) Your doctor will tell you how much you can lift at your follow-up visits.     Twisting:  Do not twist your back or neck.    Sleeping   Use a mattress with good support. Sleep in the position that is most comfortable for you.     If you are wearing a neck brace, it can help to place a small pillow or a rolled towel under your neck.     When lying on your back, place a pillow under your knees.This will lessen the stress on your back muscles.     When lying on your side, place a pillow between your legs.    Getting Dressed  Do not twist your upper body when you get dressed and undressed. Wear loose-fitting tops so that you can put them on and take them off without twisting.    Showering   Do not take a bath, sit in a hot tub, go swimming, or use a sauna until your incision is fully healed.     Do not  let your incision get wet for _3___ days. After that, you may shower.       When you shower:  -  Let soap and water gently run over the incision. Do not rub the incision.   -  Wash and dry as far as you can without bending. Have someone else wash and dry the rest of your body.  -  Apply a new dry gauze dressing after you shower.    Wound Care   You may see a small amount of drainage from your incision. This should slowly lessen and then stop.     Keep your incision dry and clean. Change the dry gauze dressing at least once a day.     ? Do not apply creams, ointments, lotions or powders to your incision.    ? After your incision stops draining, you no longer need to apply a gauze dressing unless needed for comfort.    ? Do not peel off any of the skin glue applied during surgery.     Avoid any movements that might cause your incision to open.      Do not smoke or use nicotine products. This can slow or prevent wound healing.     It is OK to take acetaminophen (Tylenol) and non-steroidal anti-inflammatory drugs (NSAIDs) such as ibuprofen (Advil, Motrin) or naproxen (Aleve, Naprosyn). These medicines can help lessen pain and inflammation. Follow the dose instructions on the bottle.    Activities   Avoid strenuous pushing, pulling, and lifting. Ask someone else for help with activities such as lifting groceries, doing household chores or yard work, or picking up children, pets, and other items.     For 2 weeks, limit long car rides and flights to prevent a blood clot. If you need top sit in a car or airplane for a long time, stretch or walk for 5 to 10 minutes every 30 to 45 minutes.     Do not drive if you are waring a neck brace.     Keep moving. Walk 2 to 3 times daily. Gently swing your arms while walking. Start slowly and increase your distance as you feel stronger.     Practice good posture to keep your abdominal muscles strong.     Use helpful devices during   your recovery. These may include a shower  chair, a shower head that you can hold in your hand, a reach-and-grab tool, a long-handled loofa, a raised toilet seat, and ice packs. Try to get these tools before surgery. You can find many of them online at www.amazon.com or at a medical supply store.    Getting Out of Bed  Use the 3-step "logroll" method to get out of bed.        Sexual Activity  When you can resume sexual activity depends on how quickly you recover after surgery. It is beset to wait until you talk with your doctor at your follow-up visit. Ask when it is OK to start being sexually active. If you have sex, be sure to follow your BLT precautions (see page 1).    When to Call  Call the clinic at [phone] and press 2 if you have questions about your health or have any of these symptoms:     Fever above 100.0 F (37.8 C)   More redness, heat, drainage or swelling at your incision   New or worse pain    Severe headache    Feeling very tired    Change in bowel or bladder control   Change in numbness or weakness in your arms or legs

## 2019-02-12 ENCOUNTER — Encounter (HOSPITAL_BASED_OUTPATIENT_CLINIC_OR_DEPARTMENT_OTHER): Payer: No Typology Code available for payment source

## 2019-02-12 ENCOUNTER — Other Ambulatory Visit (HOSPITAL_COMMUNITY): Payer: Self-pay | Admitting: Orthopaedic Surgery

## 2019-02-12 ENCOUNTER — Ambulatory Visit (HOSPITAL_BASED_OUTPATIENT_CLINIC_OR_DEPARTMENT_OTHER): Payer: No Typology Code available for payment source

## 2019-02-12 ENCOUNTER — Other Ambulatory Visit: Payer: Self-pay

## 2019-02-12 ENCOUNTER — Ambulatory Visit (HOSPITAL_BASED_OUTPATIENT_CLINIC_OR_DEPARTMENT_OTHER): Payer: No Typology Code available for payment source | Admitting: Orthopaedic Surgery of the Spine

## 2019-02-12 ENCOUNTER — Ambulatory Visit (HOSPITAL_BASED_OUTPATIENT_CLINIC_OR_DEPARTMENT_OTHER)
Admission: RE | Admit: 2019-02-12 | Discharge: 2019-02-12 | Disposition: A | Discharge status: Home / Self Care | Payer: No Typology Code available for payment source | Attending: Orthopaedic Surgery of the Spine | Admitting: Orthopaedic Surgery of the Spine

## 2019-02-12 DIAGNOSIS — X58XXXA Exposure to other specified factors, initial encounter: Secondary | ICD-10-CM | POA: Insufficient documentation

## 2019-02-12 DIAGNOSIS — T84498A Other mechanical complication of other internal orthopedic devices, implants and grafts, initial encounter: Secondary | ICD-10-CM | POA: Insufficient documentation

## 2019-02-12 DIAGNOSIS — Z981 Arthrodesis status: Secondary | ICD-10-CM | POA: Insufficient documentation

## 2019-02-12 DIAGNOSIS — T8484XA Pain due to internal orthopedic prosthetic devices, implants and grafts, initial encounter: Secondary | ICD-10-CM | POA: Insufficient documentation

## 2019-02-12 DIAGNOSIS — T84216A Breakdown (mechanical) of internal fixation device of vertebrae, initial encounter: Secondary | ICD-10-CM

## 2019-02-12 MED ORDER — HYDROCODONE-ACETAMINOPHEN 5-325 MG OR TABS
ORAL_TABLET | ORAL | 0 refills | Status: AC
Start: 2019-02-12 — End: 2019-08-11

## 2019-02-13 ENCOUNTER — Ambulatory Visit: Payer: Self-pay

## 2019-02-13 NOTE — Telephone Encounter (Signed)
Mom calling for her son the patient. Patient was discharged from [facility] yesterday after having hardware removal from his lumbar spine.  States patient had  Hydrocodone at 0900 and 1530 and in between he had Acetaminophen 1000mg  at 1000 and then again at 1530 and then Ibuprofen at 1730. States his pain is a 6 right now. Instructed that the Hydrocodone has 325mg  of Acetaminophen in it and should only give 1 additional Acetaminophen 500 with the dose, then alternate in 3 hours with the Ibuprofen. They will attempt this dosing and CB to the Nurse Line as needed for further issues or concerns.

## 2019-02-13 NOTE — Telephone Encounter (Signed)
Reason for Disposition  . Other post-op symptom or question    Protocols used: POST-OP SYMPTOMS AND QUESTIONS-ADULT - AH

## 2019-02-13 NOTE — Telephone Encounter (Signed)
Regarding: [facility]ine:  Pain management, pain of a level 8; s/p post op  ----- Message from Tobie Poet, Coordinator sent at 02/13/2019  7:16 PM PST -----  [facility] - Inpatient Spine:  Pain management, pain of a level 8; s/p post op

## 2019-02-16 NOTE — Telephone Encounter (Signed)
Pt & mother to call back if there are any issues as stated by [facility], RN in note below. Closing TE.

## 2019-02-24 ENCOUNTER — Other Ambulatory Visit (HOSPITAL_BASED_OUTPATIENT_CLINIC_OR_DEPARTMENT_OTHER): Payer: Self-pay | Admitting: Physician Assistant

## 2019-02-24 DIAGNOSIS — M4325 Fusion of spine, thoracolumbar region: Secondary | ICD-10-CM

## 2019-03-03 ENCOUNTER — Encounter (HOSPITAL_BASED_OUTPATIENT_CLINIC_OR_DEPARTMENT_OTHER): Payer: Self-pay | Admitting: Physician Assistant

## 2019-03-03 ENCOUNTER — Ambulatory Visit (HOSPITAL_BASED_OUTPATIENT_CLINIC_OR_DEPARTMENT_OTHER): Payer: No Typology Code available for payment source | Admitting: Diagnostic Radiology

## 2019-03-03 ENCOUNTER — Ambulatory Visit (HOSPITAL_BASED_OUTPATIENT_CLINIC_OR_DEPARTMENT_OTHER): Payer: No Typology Code available for payment source | Attending: Physician Assistant | Admitting: Physician Assistant

## 2019-03-03 VITALS — BP 150/82 | HR 75 | Temp 97.9°F | Resp 16 | Ht 68.0 in | Wt 190.0 lb

## 2019-03-03 DIAGNOSIS — M4325 Fusion of spine, thoracolumbar region: Secondary | ICD-10-CM | POA: Insufficient documentation

## 2019-03-03 DIAGNOSIS — Z09 Encounter for follow-up examination after completed treatment for conditions other than malignant neoplasm: Secondary | ICD-10-CM | POA: Insufficient documentation

## 2019-03-03 DIAGNOSIS — Z9889 Other specified postprocedural states: Secondary | ICD-10-CM | POA: Insufficient documentation

## 2019-03-03 DIAGNOSIS — Z4789 Encounter for other orthopedic aftercare: Secondary | ICD-10-CM | POA: Insufficient documentation

## 2019-03-03 NOTE — Progress Notes (Signed)
03/03/2019     ORTHOPEDIC SPINE CLINIC NOTE    CHIEF COMPLAINT: post op visit  DATE OF SURGERY: 02/12/19   SURGICAL PROCEDURE: T12-L2 HARDWARE REMOVAL  SURGEON: Dr Dorothea Ogle      SUBJECTIVE: Eric Flowers presents today now 3 weeks out from above surgery.  He reports he has improved since surgery. Back pain is better. Currently reports no back pain, no new neurologic symptoms.     Wound is  healing well. There HAS NOT been drainage.     Pain controlled YES with no medications. .         ROS: He denies fevers, chills, chest pain, shortness of breath, leg pain since surgery.         OBJECTIVE:  BP (!) 150/82   Pulse 75   Temp 97.9 F (36.6 C) (Temporal)   Resp 16   Ht 5\' 8"  (1.727 m)   Wt 190 lb (86.2 kg)   SpO2 100%   BMI 28.89 kg/m     INCISION: wound is  well approximated. Sutures are intact and removed without complication. There is  erythema, induration, tenderness or drainage.     MSK/NEURO:     Motor Strength Right Left   L2 (hip flexion) 5/5 5/5   L3 (Knee extension) 5/5 5/5   L4 (ankle DF) 5/5 5/5   L5(EHL) 5/5 5/5   S1 (ankle PF 5/5 5/5   Knee flexion 5/5 5/5     Sensation Right Left   L2 normal normal   L3 normal normal   L4   normal normal   L5 normal normal   S1 normal normal       Gait is normal       IMAGING:  2 views of the lumbar spine were obtained upon arrival today and independently reviewed showing:   -- hardware has been removed with residual interosseous right L2 screw.   -- overall alignment in unchanged with approximately 20 degrees of kyphosis measure from superior endplate of M84 to inferior endplate of L2.     ASSESSMENT: 3 weeks s/p above surgery, is progressing as expected.     PLAN:   Patient is planning to return to college in [place] in a couple of weeks. I advised that he avoid any weight lifting until mid February (23months after surgery) and then he can start with light weights and resume as tolerated. He may resume low impact aerobic exercise now as  tolerated.     Continue to monitor the incision until fully healed.     Follow up as needed.   Eric Deis, PA-C      Past Medical History:   Diagnosis Date   . Blood transfusion without reported diagnosis 11/2010    during his surgery   . Fracture of left pelvis ([facility])    . Shoulder fracture, right    . Spine fracture     L1 + L2         Current Outpatient Medications:   .  Dexmethylphenidate HCl (FOCALIN) 10 MG Oral Tab, Take 20 mg by mouth. , Disp: , Rfl:   .  Dexmethylphenidate HCl ER 15 MG Oral CAPSULE SR 24 HR, , Disp: , Rfl:   .  GuanFACINE HCl 1 MG Oral Tab, , Disp: , Rfl:   .  GuanFACINE HCl ER 2 MG Oral TABLET SR 24 HR, , Disp: , Rfl:

## 2019-03-04 ENCOUNTER — Telehealth (HOSPITAL_BASED_OUTPATIENT_CLINIC_OR_DEPARTMENT_OTHER): Payer: Self-pay | Admitting: Orthopaedic Surgery of the Spine

## 2019-03-04 NOTE — Telephone Encounter (Signed)
I left a message with the patient's mother Eric Flowers to call me back to discuss her request.  CPT codes for established patient's office visit  [id number]  His post-op visit is within the 90 day global but I advised her to call their insurance for verification.

## 2019-03-04 NOTE — Telephone Encounter (Signed)
RETURN CALL: Voicemail - Detailed Message      SUBJECT:  General Message     MESSAGE: Mom needs to know the CPT code for patient's appt on 03/12/19.  There is an issue with insurance ending 03/05/19 she plans to pre-pay so needs this as soon as possible.    Thank you.

## 2019-03-12 ENCOUNTER — Encounter (HOSPITAL_BASED_OUTPATIENT_CLINIC_OR_DEPARTMENT_OTHER): Payer: Self-pay | Admitting: Orthopaedic Surgery of the Spine

## 2019-08-20 ENCOUNTER — Emergency Department: Admission: EM | Admit: 2019-08-20 | Discharge: 2019-08-20 | Payer: Self-pay

## 2019-08-20 DIAGNOSIS — Z0283 Encounter for blood-alcohol and blood-drug test: Secondary | ICD-10-CM | POA: Insufficient documentation

## 2019-08-20 NOTE — ED Notes (Signed)
Legal blood draw completed, see blood draw charting. Pt d/c w/ [facility].      Sherron Monday, RN  08/20/19 1120

## 2019-09-30 ENCOUNTER — Emergency Department
Admission: EM | Admit: 2019-09-30 | Discharge: 2019-09-30 | Payer: PRIVATE HEALTH INSURANCE | Attending: Emergency Medicine | Admitting: Emergency Medicine

## 2019-09-30 ENCOUNTER — Emergency Department (EMERGENCY_DEPARTMENT_HOSPITAL): Payer: PRIVATE HEALTH INSURANCE

## 2019-09-30 DIAGNOSIS — S93401A Sprain of unspecified ligament of right ankle, initial encounter: Secondary | ICD-10-CM

## 2019-09-30 DIAGNOSIS — M25571 Pain in right ankle and joints of right foot: Secondary | ICD-10-CM
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
## 2019-09-30 NOTE — Discharge Instructions (Addendum)
Please follow up with your primary care provider in a week for continued evaluation and care. If you lose sensation to that foot, it becomes pale and cold, the pain significantly worsens or you have any other significant concerns please report to the ED for re-evaluation.
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
## 2019-09-30 NOTE — ED Notes (Signed)
D/c papers given to [facility], pt given d/c instructions, stated understanding. Pt transferred self to w/c and d/c to jail in no observed distress.     [name], [name]  09/30/19 [phone]

## 2019-10-07 ENCOUNTER — Telehealth (HOSPITAL_BASED_OUTPATIENT_CLINIC_OR_DEPARTMENT_OTHER): Payer: Self-pay | Admitting: Physical Medicine & Rehabilitation

## 2019-10-07 NOTE — Telephone Encounter (Signed)
RETURN CALL: Voicemail - Detailed Message      SUBJECT:  Appointment Request     REASON FOR VISIT: Left ankle pain, Had imaging done at [facility] 09/30/19  PREFERRED DATE/TIME: Any morning or anytime tuesday or wednesday  ADDITIONAL INFORMATION: Patient is established with Dr Brooke Dare and is needing sooner then next available 8/16 so would like a callback to discuss scheduling with another Provider, thanks.

## 2019-10-12 ENCOUNTER — Encounter (HOSPITAL_BASED_OUTPATIENT_CLINIC_OR_DEPARTMENT_OTHER): Payer: Self-pay | Admitting: Physical Medicine & Rehabilitation

## 2019-10-12 ENCOUNTER — Ambulatory Visit
Payer: PRIVATE HEALTH INSURANCE | Attending: Physical Medicine & Rehabilitation | Admitting: Physical Medicine & Rehabilitation

## 2019-10-12 VITALS — BP 138/87 | HR 82 | Temp 97.3°F

## 2019-10-12 DIAGNOSIS — S93421D Sprain of deltoid ligament of right ankle, subsequent encounter: Secondary | ICD-10-CM | POA: Insufficient documentation
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
## 2019-10-13 NOTE — Addendum Note (Signed)
Addended byCharise Carwin on: 10/13/2019 04:31 PM     Modules accepted: Level of Service

## 2019-10-13 NOTE — Progress Notes (Signed)
I saw and evaluated the patient with resident Dr. Minkley and was present for the key portions medical encounter. I repeated the essential components of the history and exam. I discussed the case with the resident, and have reviewed the note. I agree with the findings and plan as documented in the resident's note. The progress note was edited as necessary.    Sedra Morfin W. Yumi Insalaco, M.D.  Clinical Assistant Professor & Attending Physician  [facility] at [facility]

## 2019-10-19 ENCOUNTER — Telehealth (HOSPITAL_BASED_OUTPATIENT_CLINIC_OR_DEPARTMENT_OTHER): Payer: Self-pay | Admitting: Physical Medicine & Rehabilitation

## 2019-10-19 ENCOUNTER — Other Ambulatory Visit: Payer: Self-pay

## 2019-10-19 NOTE — Telephone Encounter (Signed)
Received call from radiology scheduling- please see referral note  Patient's family needs MD to call insurance provider line to escalate this. Patient has MRI tomorrow        Eric Flowers  Patient Services Specialist  [facility]  Ph: [phone] opt. 2  F: [phone]

## 2019-10-19 NOTE — Telephone Encounter (Signed)
[facility] MRI scheduled 10-20-2019.    Re: Eric Flowers    MRN: [id number]   OP DOS     Insurance: [facility] [employer]   Case# / Reference#: [id number]     Diagnosis: S93.421D (ICD-10-CM) - V58.89, 845.01 (ICD-9-CM) - Sprain of deltoid ligament of right ankle, subsequent encounter   CPT code: 53976     The procedure/exam requested is still pending with the payer/reviewing agency for this date of service.     Please notify the Physician and the Patient of this pending status to discuss next steps of care. Radiology scheduling has been copied to notify them of the pending status.     If determined that this service is urgent, please attach appropriate clinical documentation explaining why it is urgent so I can follow up with the payer/reviewing agency.     If referral is still pending prior to date of service and patient will still proceed with appointment, a notice of non-coverage form (for patients with commercial insurance) will be presented at the time of check-in. The cost estimate amount can be found on the interactive facesheet.     Thank you,     Lum Keas   Phone: [phone]    Referral    Ricci Barker   Patient Service Specialist  [facility]   Phone:  [phone] Option 2  Fax:  [phone]

## 2019-10-19 NOTE — Telephone Encounter (Signed)
Name: Eric Flowers  Date of last visit: 10/12/19 with Gustavus Bryant   Diagnosis: Deltoid ligament tear- likely at least partial tear of deltoid ligament complex. He may have also ruptured the retinaculum overlying the posterior tibialis tendon as it wraps around the medial malleolus. No fractures evident on XR, but does have a calcification at the medial malleolus (appeared well-corticated and old on XR) that remains quite tender. MRI will help determine extent of ligamentous damage and provide guidance on possible surgical intervention vs rehabilitation measures and return to activity.  Future appt: none yet    MRI scheduled for 10/20/19.   ID#: [id number]    NOTE from referral: Per Patient mother, she called ins. Ins states Rad need to contact clinic for the  provider to contact the [facility] provider line [phone] for pre auth escalation.   Needs to be completed today for tomorrow app. MRI pending ticket #[id number].     I called phone number above. I spoke to Cornell Barman [facility] representative who states that if this is medical emergency we can order it as such and they will take this into consideration for review. Otherwise, provider can schedule a peer to peer, which may not be scheduled for a few days as well. He can transfer me to that line.   I would like to discuss with provider to see if he wants to escalate MRI order to higher urgency.    Reviewed with Dr. Brooke Dare who states not a medical emergency.     I called patient back. I explained that he can wait until the am to see if it comes through or call and r/s now. He would like to wait so I sent him the number via mychart.     Angelita Ingles RN, Scientist, research (physical sciences), [place]  [facility] [employer]

## 2019-10-19 NOTE — Telephone Encounter (Signed)
Auth for MRI still pending.   I called patient as his MRI is scheduled for tomorrow.   He states he does not want to r/s. He states this is the only time he can get it done.   I discussed the [facility] process and he states understanding.   Message sent to fact team via referral inbasket.     Angelita Ingles RN, Scientist, research (physical sciences), [place]  [facility] [employer]

## 2019-10-20 ENCOUNTER — Ambulatory Visit
Admission: RE | Admit: 2019-10-20 | Discharge: 2019-10-20 | Disposition: A | Discharge status: Home / Self Care | Payer: PRIVATE HEALTH INSURANCE | Attending: Student in an Organized Health Care Education/Training Program | Admitting: Student in an Organized Health Care Education/Training Program

## 2019-10-20 DIAGNOSIS — S93421D Sprain of deltoid ligament of right ankle, subsequent encounter: Secondary | ICD-10-CM | POA: Insufficient documentation

## 2019-10-23 ENCOUNTER — Encounter (HOSPITAL_BASED_OUTPATIENT_CLINIC_OR_DEPARTMENT_OTHER): Payer: Self-pay
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
## 2019-10-26 ENCOUNTER — Ambulatory Visit
Payer: PRIVATE HEALTH INSURANCE | Attending: Physical Medicine & Rehabilitation | Admitting: Physical Medicine & Rehabilitation

## 2019-10-26 DIAGNOSIS — S93411D Sprain of calcaneofibular ligament of right ankle, subsequent encounter: Secondary | ICD-10-CM | POA: Insufficient documentation

## 2019-10-26 DIAGNOSIS — S93491D Sprain of other ligament of right ankle, subsequent encounter: Secondary | ICD-10-CM

## 2021-06-02 IMAGING — CR DG CHEST 2V
2 series · 2 of 2 positions shown · non-contrast
Comparison: None.

CLINICAL DATA: Chest pain

EXAM:
CHEST - 2 VIEW

[chest pa]
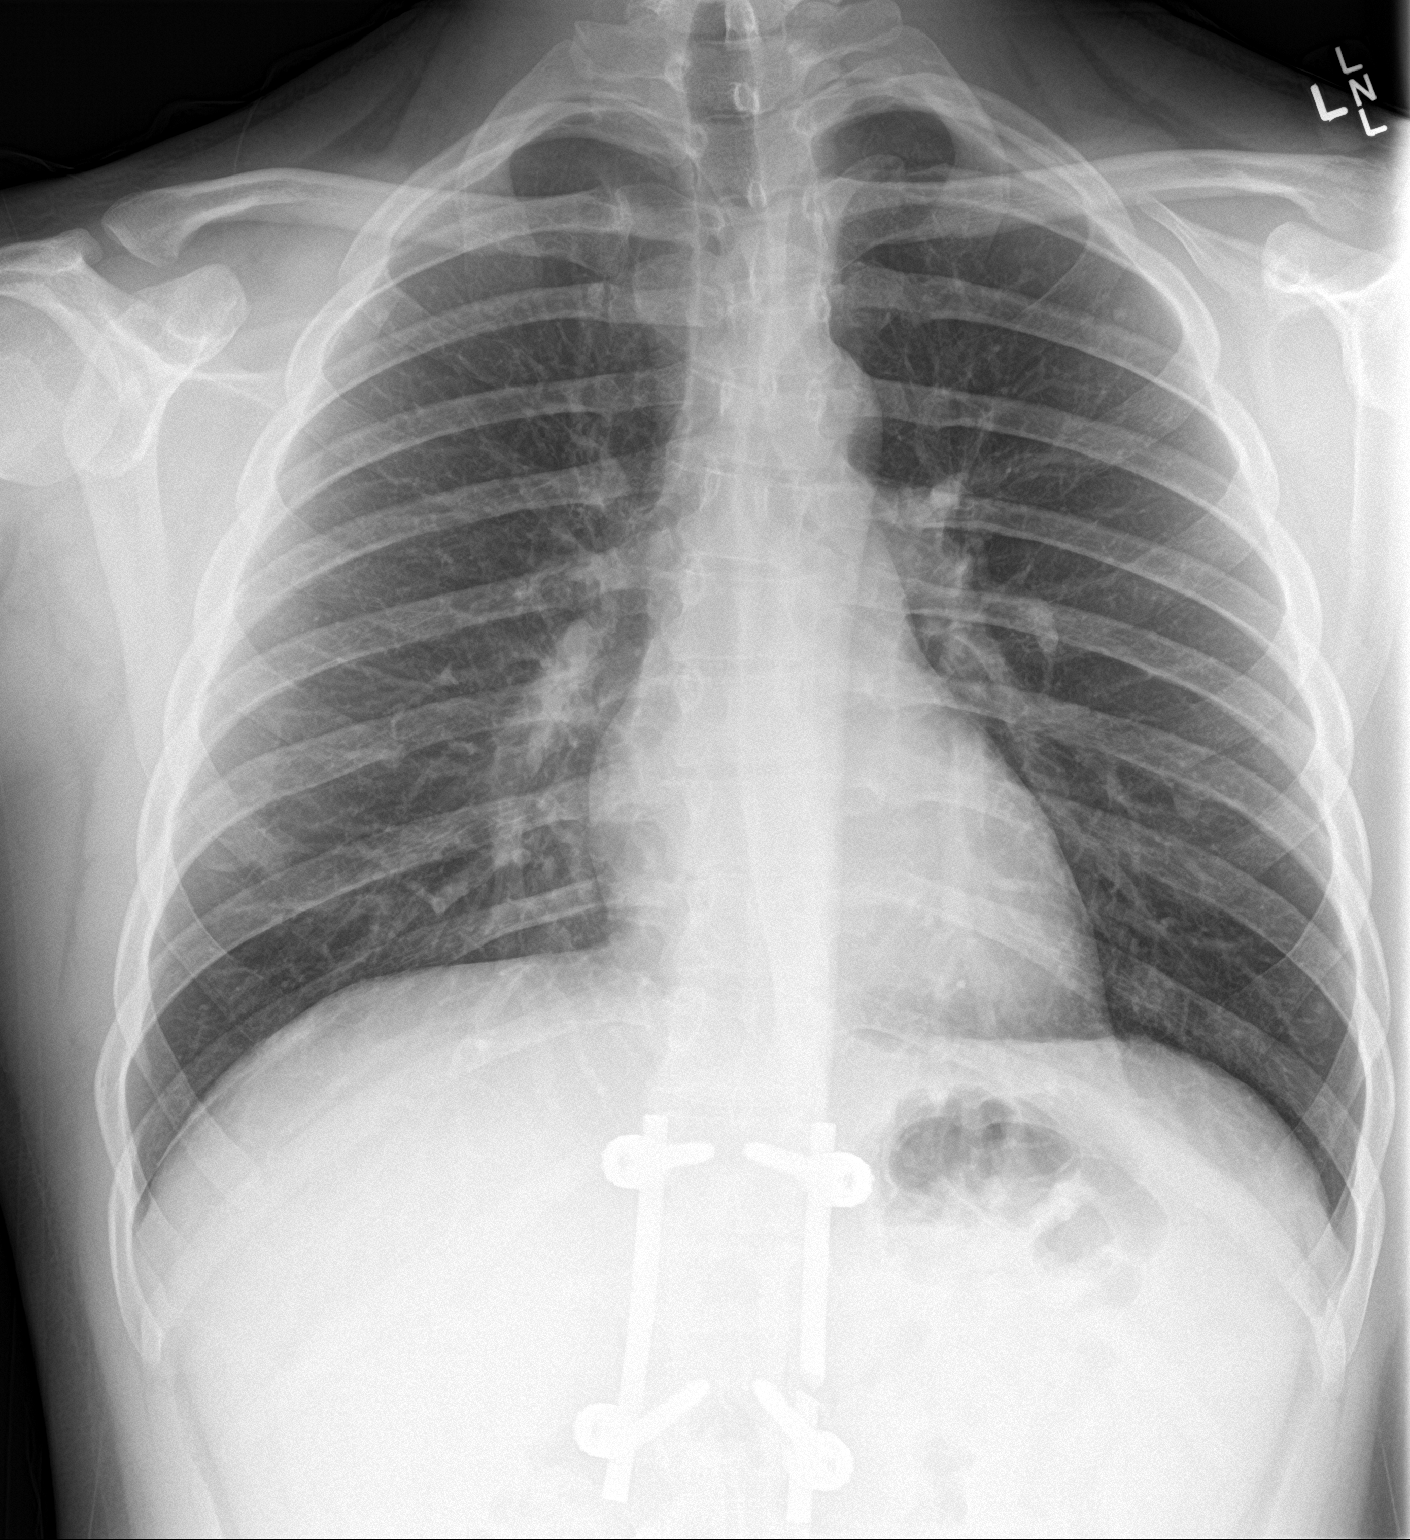

[chest lat]
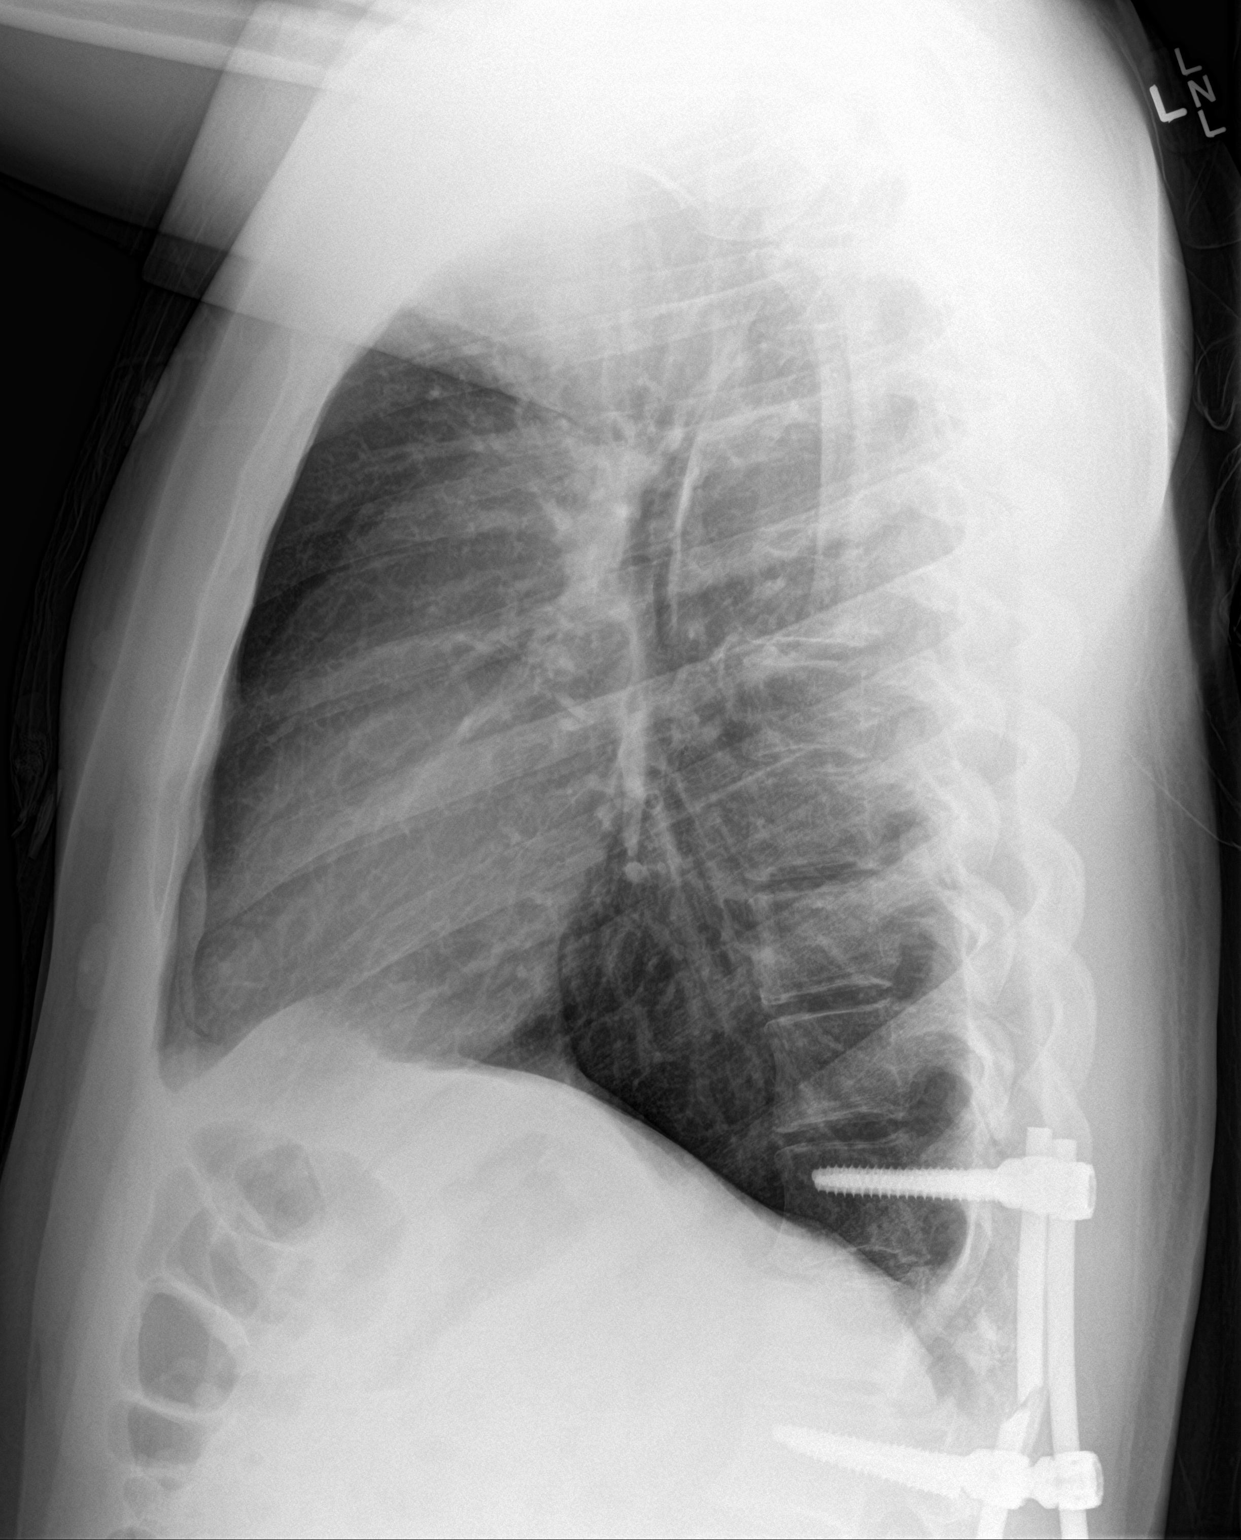

[2 of 2 positions shown; findings below may reference images not displayed]

FINDINGS: The heart size and mediastinal contours are within normal limits.
Both lungs are clear. Partially visualized spinal rods and fixating
screws. There is a fracture through the left spinal rod at the
approximate L2 level.
IMPRESSION: No active cardiopulmonary disease. Fracture through the left spinal
rod at approximate L2 level

## 2021-06-02 IMAGING — CT CT ANGIO CHEST
2 of 6 series · 18 of 36 positions shown · IV contrast (omnipaque)
Comparison: Chest radiograph 12/15/2018

CLINICAL DATA: Chest pain, pleuritic.  Whippet use.

EXAM:
CT ANGIOGRAPHY CHEST WITH CONTRAST
TECHNIQUE: Multidetector CT imaging of the chest was performed using the
standard protocol during bolus administration of intravenous
contrast. Multiplanar CT image reconstructions and MIPs were
obtained to evaluate the vascular anatomy.
CONTRAST:  100mL OMNIPAQUE IOHEXOL 350 MG/ML SOLN

[Series 7: pe thins · axial · 0.82mm/px · z∈[-306,-58]mm · 17 of 394 slices shown]
[im 20/394  lung]
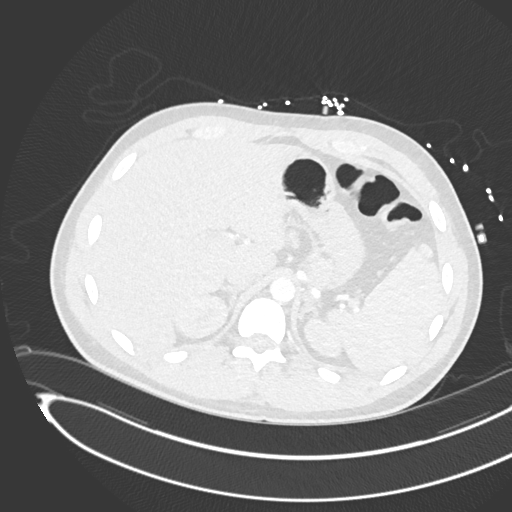
[im 40/394  mediastinal]
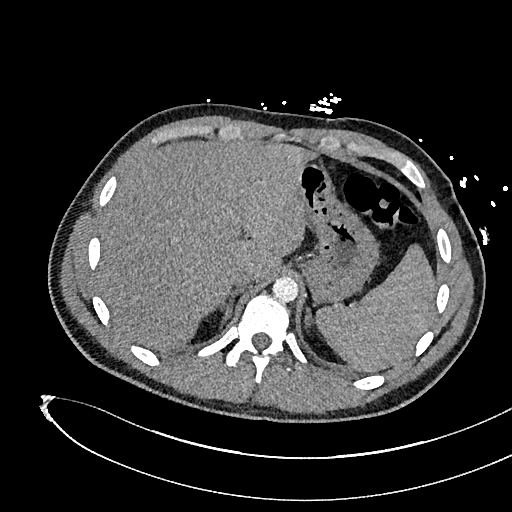
[im 59/394  lung]
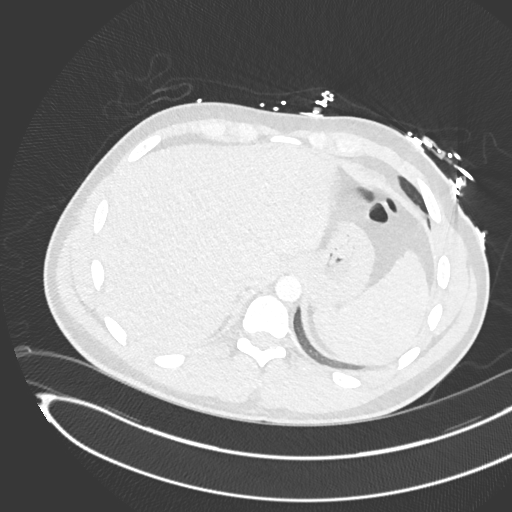
[im 79/394  mediastinal]
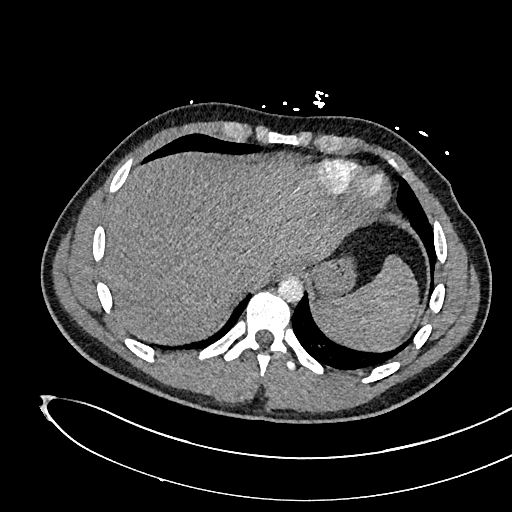
[im 118/394  lung]
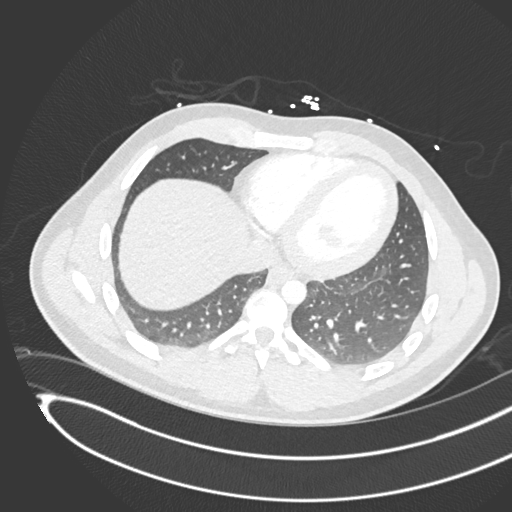
[im 138/394  mediastinal]
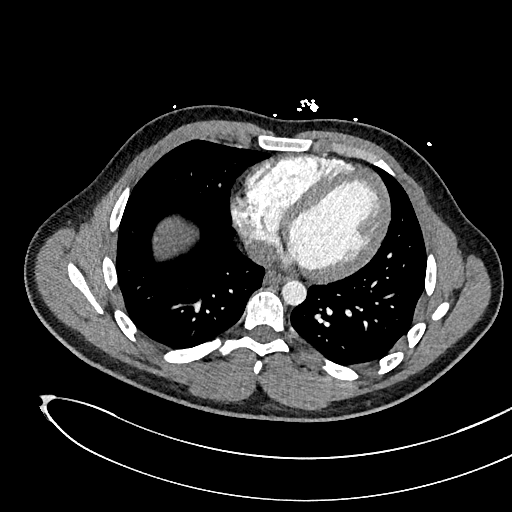
[im 158/394  lung]
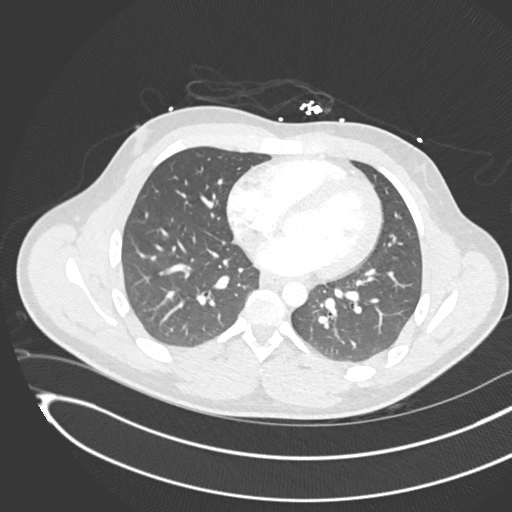
[im 177/394  mediastinal]
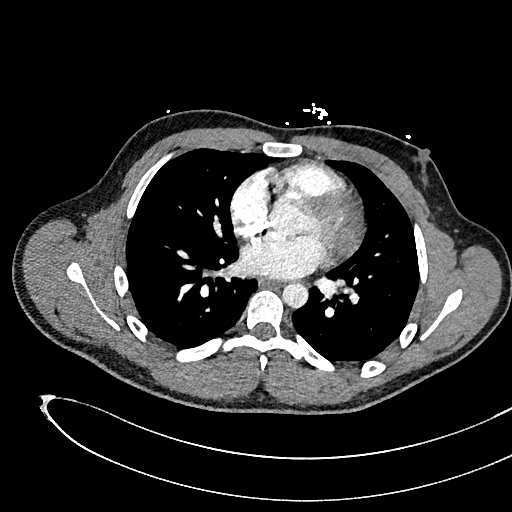
[im 197/394  lung]
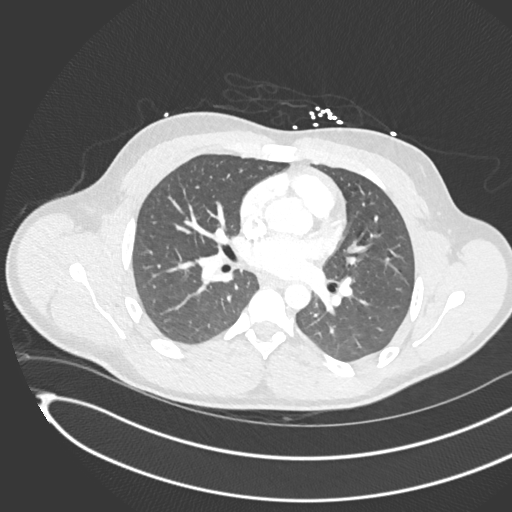
[im 217/394  mediastinal]
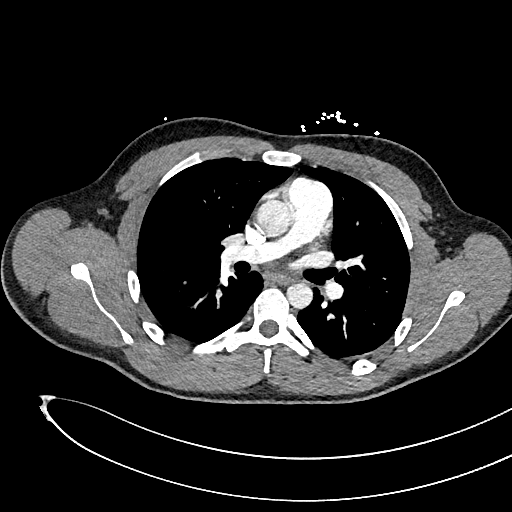
[im 236/394  lung]
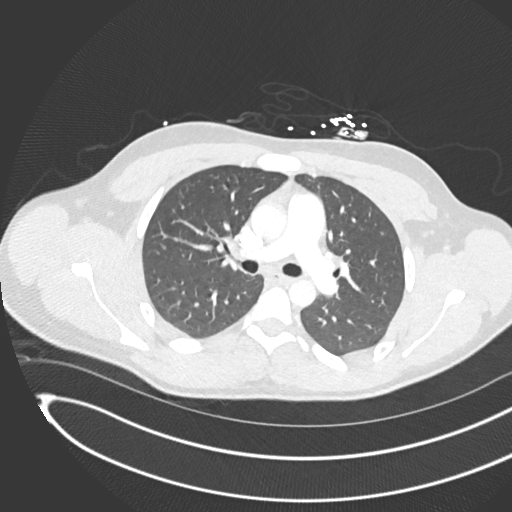
[im 256/394  mediastinal]
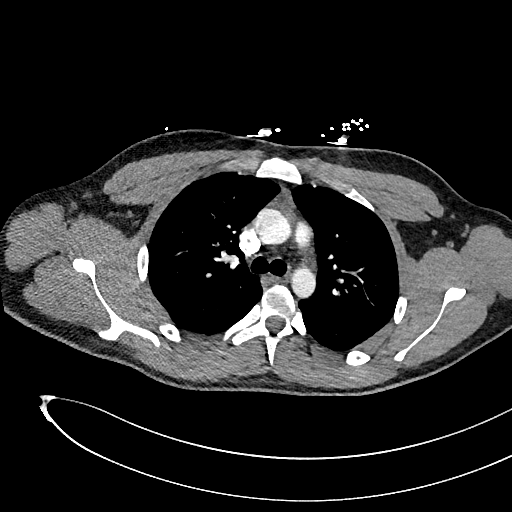
[im 276/394  lung]
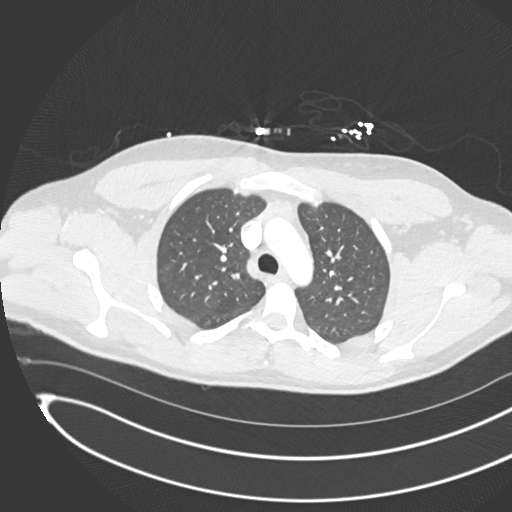
[im 315/394  mediastinal]
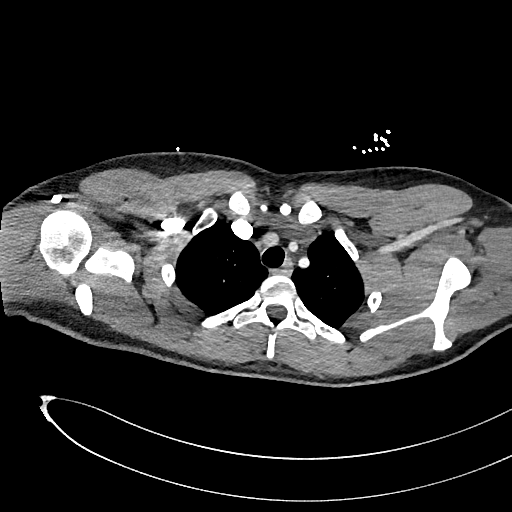
[im 335/394  lung]
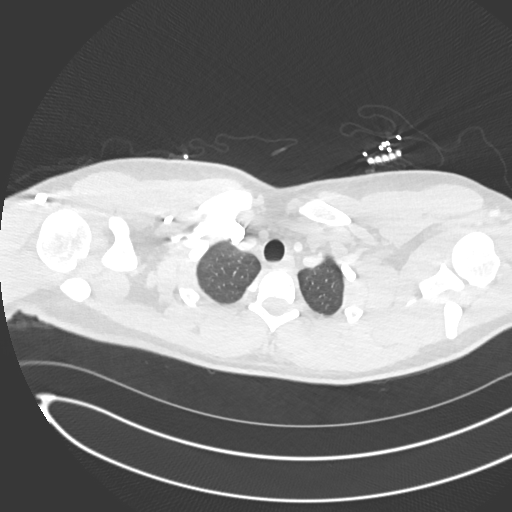
[im 354/394  mediastinal]
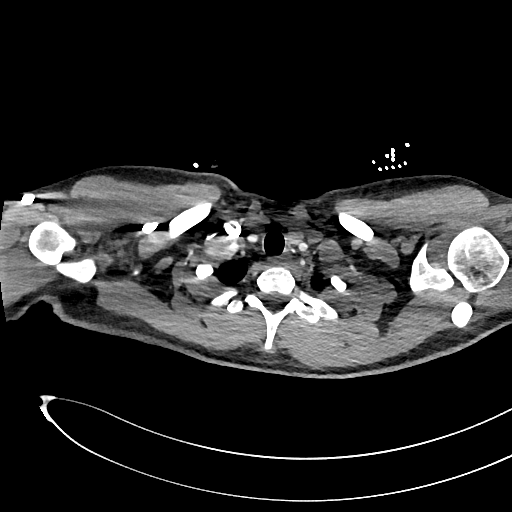
[im 374/394  lung]
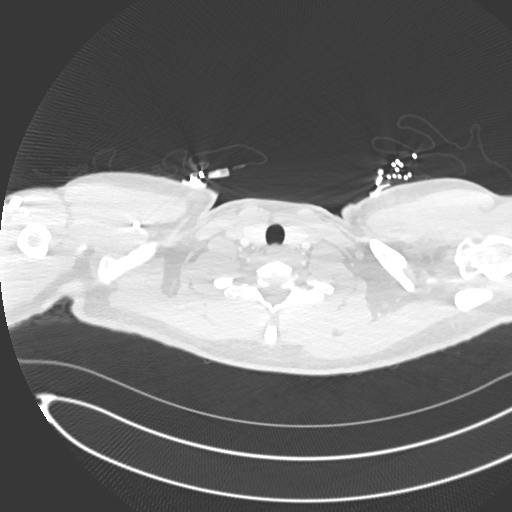

[Series 8: pe 2mm cor · coronal · 0.56mm/px · 1 of 141 slices shown]
[im 71/141  mediastinal]
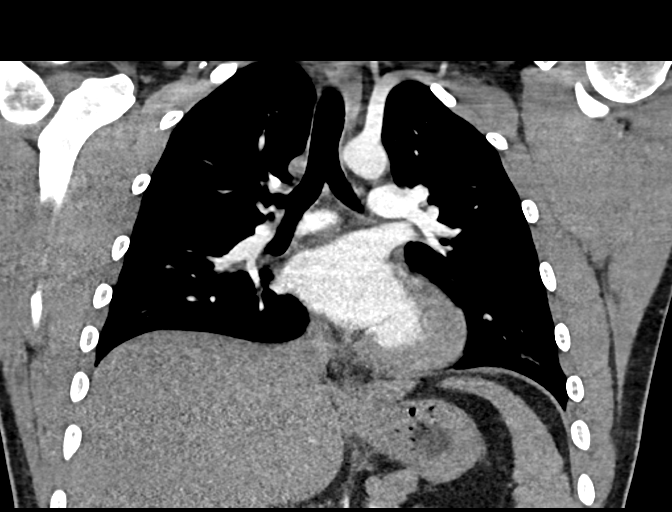

[18 of 36 positions shown; findings below may reference images not displayed]

FINDINGS: Cardiovascular: Satisfactory opacification of the pulmonary arteries
to the segmental level. No pulmonary arterial filling defects are
identified. Central pulmonary arteries are normal caliber. No CT
evidence of right heart strain with a normal RV/LV ratio (0.7). The
aorta is normal caliber. No abnormality of the aortic lumen or
periaortic stranding. Normal heart size. No pericardial effusion.

Mediastinum/Nodes: Wedge-shaped soft tissue density in the anterior
mediastinum compatible with a thymic remnant in a patient of this
age. No enlarged mediastinal or axillary lymph nodes. Thyroid gland,
trachea, and esophagus demonstrate no significant findings.

Lungs/Pleura: Some dependent atelectasis is seen posteriorly with
more bandlike opacity in the left lung base likely reflecting
subsegmental atelectasis or scarring. No consolidation, features of
edema, pneumothorax, or effusion. No suspicious pulmonary nodules or
masses.

Upper Abdomen: No acute abnormalities present in the visualized
portions of the upper abdomen.

Musculoskeletal: Mild bilateral gynecomastia. No suspicious chest
wall lesions. Mild dextrocurvature of the midthoracic spine. Lumbar
fusion hardware is only partially visualized on this exam on the
scout imaging. No acute or significant osseous findings.

Review of the MIP images confirms the above findings.
IMPRESSION: No evidence of pulmonary embolism.

No acute intrathoracic process.

Mild bilateral gynecomastia.
